# Patient Record
Sex: Female | Born: 1960
Health system: Southern US, Community
[De-identification: ages and names within clinical notes are randomized; demographics above are authoritative.]

## PROBLEM LIST (undated history)

## (undated) DIAGNOSIS — G8929 Other chronic pain: Secondary | ICD-10-CM

## (undated) DIAGNOSIS — E7800 Pure hypercholesterolemia, unspecified: Secondary | ICD-10-CM

## (undated) DIAGNOSIS — R2231 Localized swelling, mass and lump, right upper limb: Secondary | ICD-10-CM

## (undated) DIAGNOSIS — M549 Dorsalgia, unspecified: Secondary | ICD-10-CM

## (undated) DIAGNOSIS — E119 Type 2 diabetes mellitus without complications: Secondary | ICD-10-CM

## (undated) DIAGNOSIS — M1990 Unspecified osteoarthritis, unspecified site: Secondary | ICD-10-CM

## (undated) HISTORY — PX: NO PAST SURGERIES: SHX2092

## (undated) HISTORY — DX: Type 2 diabetes mellitus without complications: E11.9

---

## 2008-07-04 ENCOUNTER — Ambulatory Visit: Payer: Self-pay | Admitting: Nurse Practitioner

## 2008-07-04 DIAGNOSIS — K5900 Constipation, unspecified: Secondary | ICD-10-CM | POA: Insufficient documentation

## 2008-07-04 DIAGNOSIS — H547 Unspecified visual loss: Secondary | ICD-10-CM | POA: Insufficient documentation

## 2008-07-04 DIAGNOSIS — K6289 Other specified diseases of anus and rectum: Secondary | ICD-10-CM | POA: Insufficient documentation

## 2008-07-04 LAB — CONVERTED CEMR LAB
Bilirubin Urine: NEGATIVE
Blood in Urine, dipstick: NEGATIVE
Ketones, urine, test strip: NEGATIVE
pH: 5.5

## 2008-08-08 ENCOUNTER — Ambulatory Visit: Payer: Self-pay | Admitting: Nurse Practitioner

## 2008-08-08 DIAGNOSIS — R109 Unspecified abdominal pain: Secondary | ICD-10-CM | POA: Insufficient documentation

## 2008-08-08 LAB — CONVERTED CEMR LAB
Bilirubin Urine: NEGATIVE
Blood in Urine, dipstick: NEGATIVE
KOH Prep: NEGATIVE
Ketones, urine, test strip: NEGATIVE

## 2008-08-09 LAB — CONVERTED CEMR LAB
Albumin: 4.8 g/dL (ref 3.5–5.2)
Alkaline Phosphatase: 36 units/L — ABNORMAL LOW (ref 39–117)
CO2: 24 meq/L (ref 19–32)
Chlamydia, DNA Probe: NEGATIVE
Eosinophils Absolute: 0.1 10*3/uL (ref 0.0–0.7)
GC Probe Amp, Genital: NEGATIVE
Glucose, Bld: 109 mg/dL — ABNORMAL HIGH (ref 70–99)
LDL Cholesterol: 152 mg/dL — ABNORMAL HIGH (ref 0–99)
Lymphocytes Relative: 49 % — ABNORMAL HIGH (ref 12–46)
Lymphs Abs: 2.4 10*3/uL (ref 0.7–4.0)
Neutro Abs: 2 10*3/uL (ref 1.7–7.7)
Neutrophils Relative %: 41 % — ABNORMAL LOW (ref 43–77)
Platelets: 168 10*3/uL (ref 150–400)
Potassium: 4.2 meq/L (ref 3.5–5.3)
Sodium: 141 meq/L (ref 135–145)
Total Protein: 8.4 g/dL — ABNORMAL HIGH (ref 6.0–8.3)
Triglycerides: 52 mg/dL (ref <150)
WBC: 4.8 10*3/uL (ref 4.0–10.5)

## 2008-08-14 ENCOUNTER — Ambulatory Visit (HOSPITAL_COMMUNITY): Admission: RE | Admit: 2008-08-14 | Discharge: 2008-08-14 | Payer: Self-pay | Admitting: Family Medicine

## 2008-08-21 ENCOUNTER — Encounter: Admission: RE | Admit: 2008-08-21 | Discharge: 2008-08-21 | Payer: Self-pay | Admitting: Family Medicine

## 2008-08-22 ENCOUNTER — Encounter (INDEPENDENT_AMBULATORY_CARE_PROVIDER_SITE_OTHER): Payer: Self-pay | Admitting: Nurse Practitioner

## 2008-09-03 ENCOUNTER — Ambulatory Visit: Payer: Self-pay | Admitting: Nurse Practitioner

## 2008-09-03 DIAGNOSIS — M545 Low back pain, unspecified: Secondary | ICD-10-CM | POA: Insufficient documentation

## 2008-09-03 DIAGNOSIS — E7800 Pure hypercholesterolemia, unspecified: Secondary | ICD-10-CM | POA: Insufficient documentation

## 2008-09-03 DIAGNOSIS — D649 Anemia, unspecified: Secondary | ICD-10-CM | POA: Insufficient documentation

## 2008-09-06 ENCOUNTER — Ambulatory Visit: Payer: Self-pay | Admitting: *Deleted

## 2008-10-03 ENCOUNTER — Ambulatory Visit: Payer: Self-pay | Admitting: Nurse Practitioner

## 2008-10-05 LAB — CONVERTED CEMR LAB
Eosinophils Relative: 3 % (ref 0–5)
HCT: 34.6 % — ABNORMAL LOW (ref 36.0–46.0)
Lymphocytes Relative: 50 % — ABNORMAL HIGH (ref 12–46)
Lymphs Abs: 2.3 10*3/uL (ref 0.7–4.0)
Neutro Abs: 1.7 10*3/uL (ref 1.7–7.7)
Neutrophils Relative %: 38 % — ABNORMAL LOW (ref 43–77)
Platelets: 135 10*3/uL — ABNORMAL LOW (ref 150–400)
WBC: 4.6 10*3/uL (ref 4.0–10.5)

## 2008-12-31 ENCOUNTER — Ambulatory Visit: Payer: Self-pay | Admitting: Nurse Practitioner

## 2008-12-31 DIAGNOSIS — R5383 Other fatigue: Secondary | ICD-10-CM

## 2008-12-31 DIAGNOSIS — R5381 Other malaise: Secondary | ICD-10-CM | POA: Insufficient documentation

## 2009-01-07 ENCOUNTER — Ambulatory Visit (HOSPITAL_COMMUNITY): Admission: RE | Admit: 2009-01-07 | Discharge: 2009-01-07 | Payer: Self-pay | Admitting: Internal Medicine

## 2009-05-30 ENCOUNTER — Ambulatory Visit: Payer: Self-pay | Admitting: Nurse Practitioner

## 2009-05-31 ENCOUNTER — Ambulatory Visit (HOSPITAL_COMMUNITY): Admission: RE | Admit: 2009-05-31 | Discharge: 2009-05-31 | Payer: Self-pay | Admitting: Nurse Practitioner

## 2009-06-03 ENCOUNTER — Encounter (INDEPENDENT_AMBULATORY_CARE_PROVIDER_SITE_OTHER): Payer: Self-pay | Admitting: Nurse Practitioner

## 2009-09-11 IMAGING — CR DG LUMBAR SPINE COMPLETE 4+V
5 series · 5 of 5 positions shown · non-contrast
Comparison: None.

CLINICAL DATA: 48-year-old female with low back pain status post
fall 1 year ago.

LUMBAR SPINE - COMPLETE 4+ VIEW

[t l-spine a.p.]
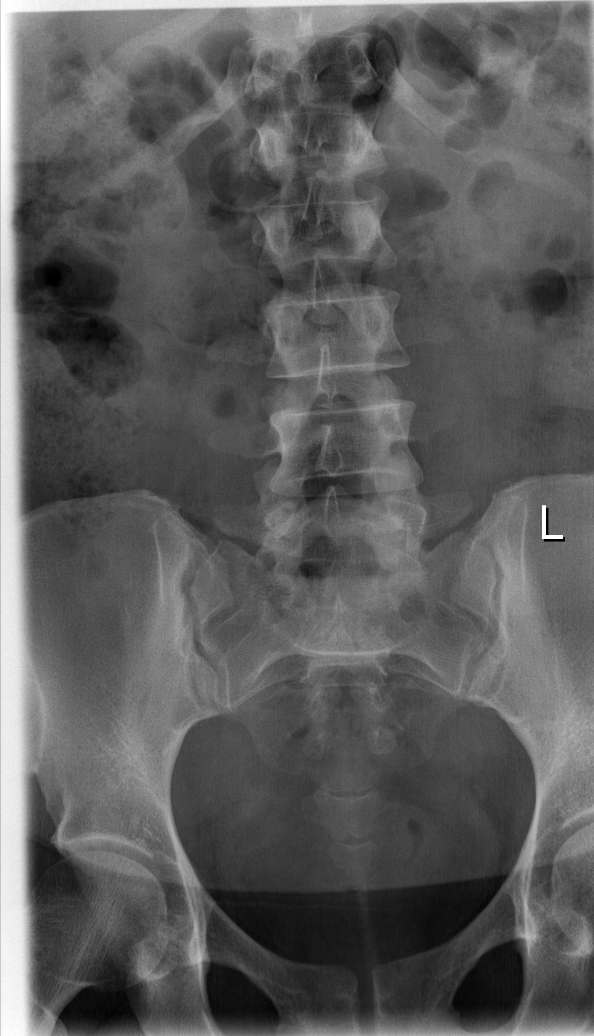

[t l-spine oblique exposure (1 of 2)]
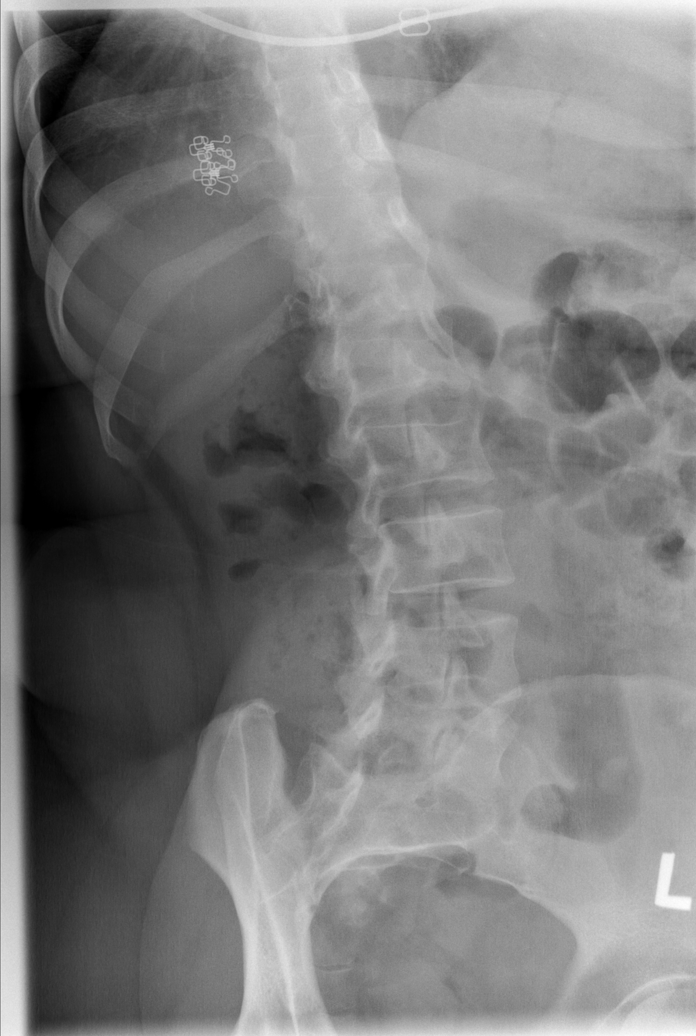

[t l-spine lat]
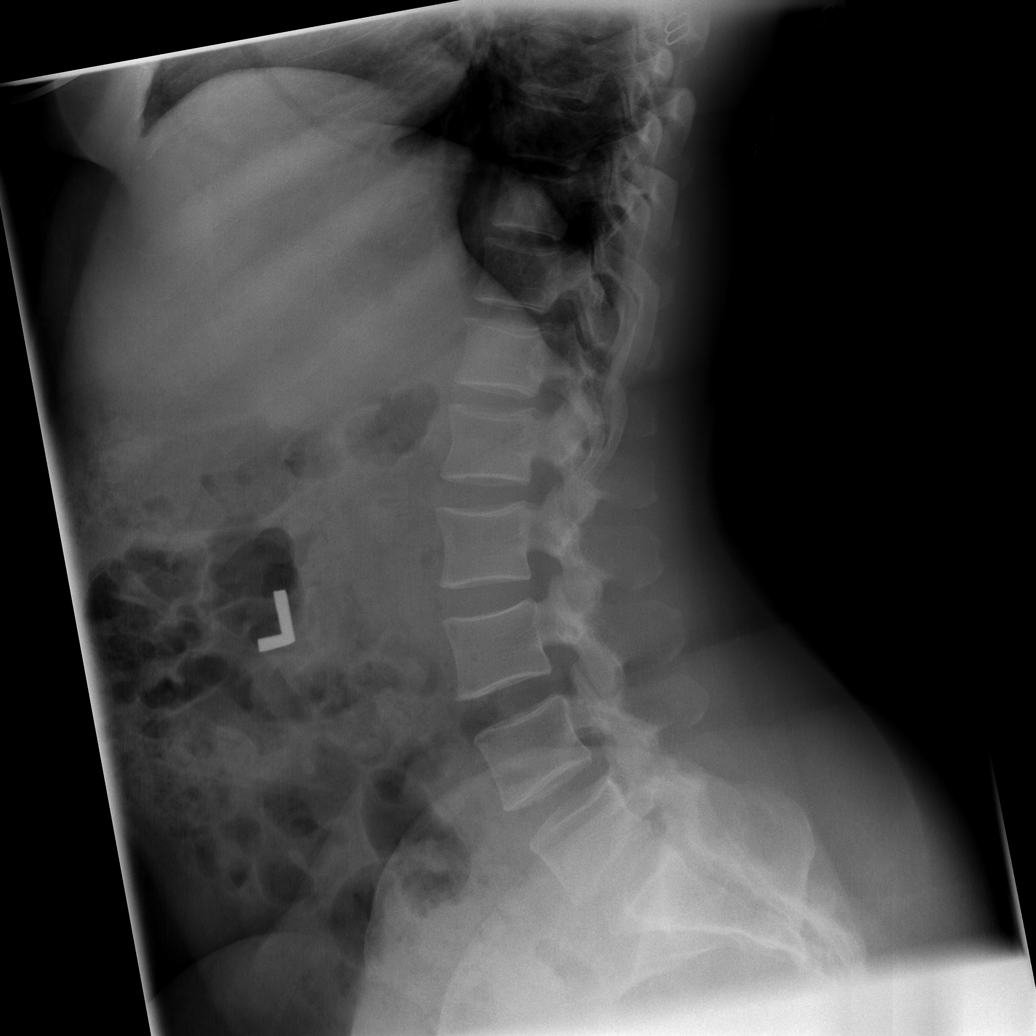

[t l-spine l5-s1 spot]
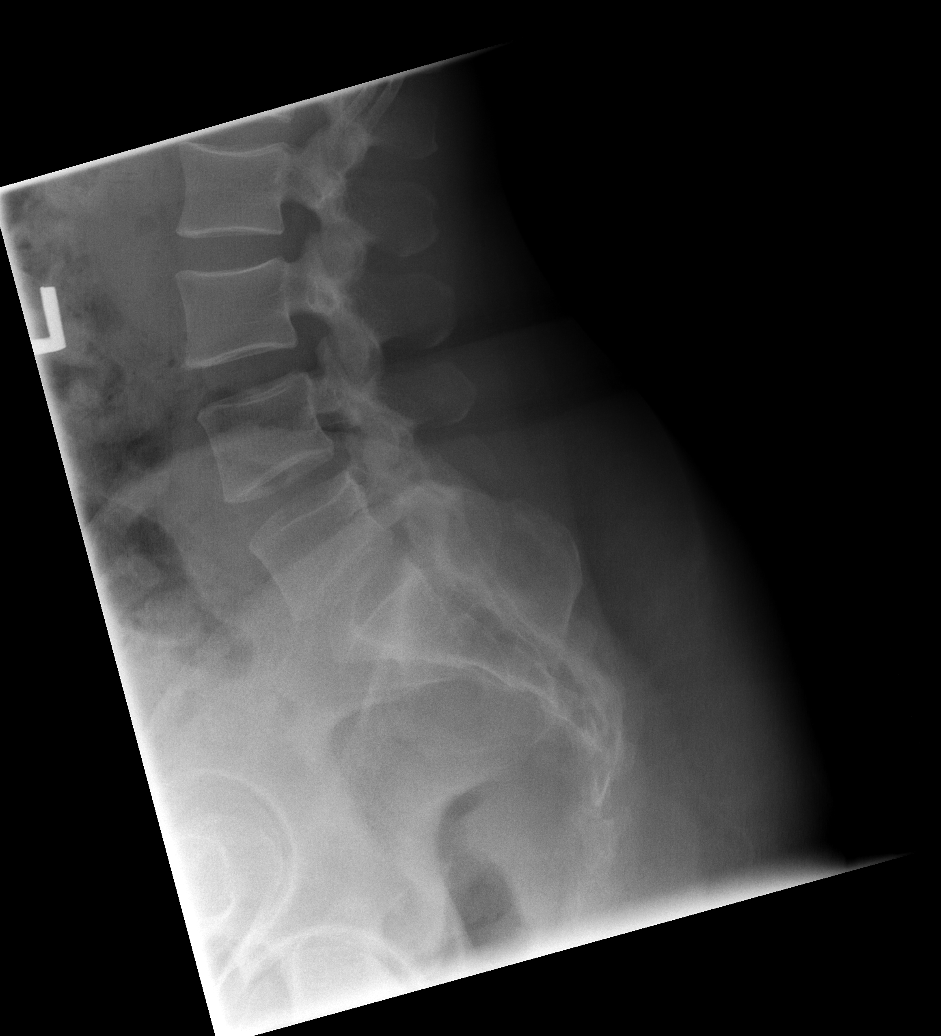

[t l-spine oblique exposure (2 of 2)]
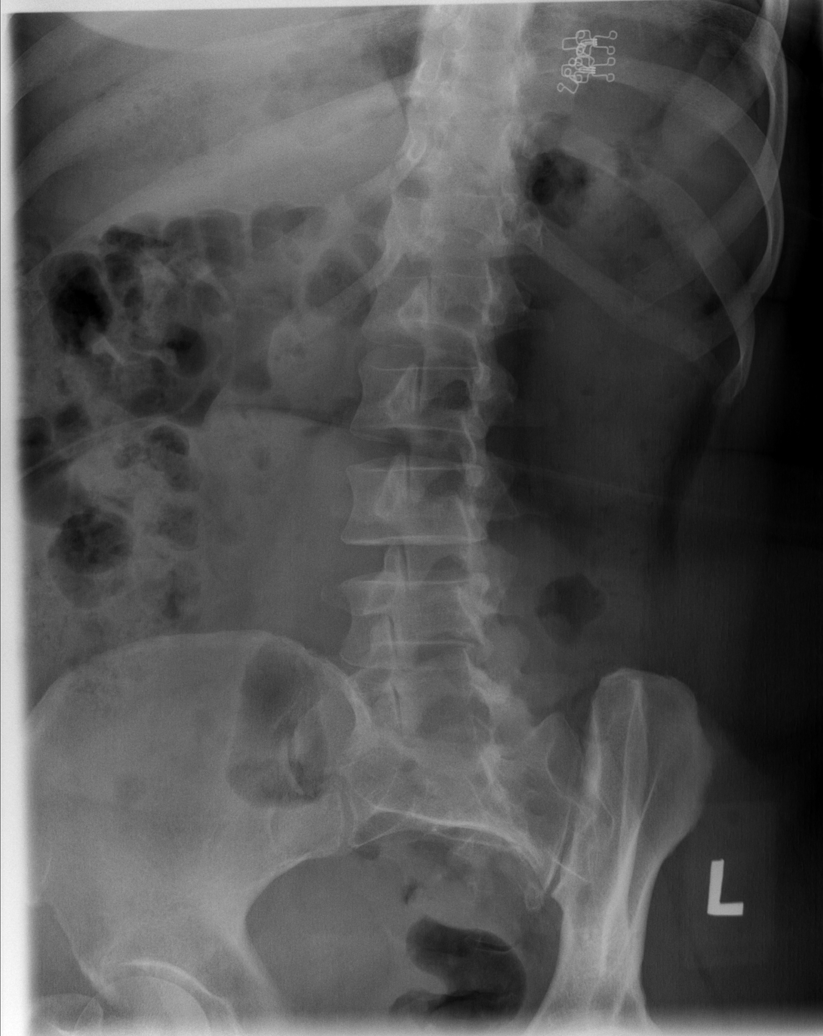

[5 of 5 positions shown; findings below may reference images not displayed]

FINDINGS: Normal lumbar segmentation.  Vertebral body height and
alignment are normal.  Bone mineralization is within normal limits.
Relatively preserved disc spaces throughout.  Sacrum and SI joints
appear within normal limits.  No pars fracture.  Mild L5-S1 facet
hypertrophy.
IMPRESSION: Negative lumbar radiographs for age.

## 2010-06-30 ENCOUNTER — Emergency Department (HOSPITAL_COMMUNITY): Admission: EM | Admit: 2010-06-30 | Discharge: 2010-07-01 | Payer: Self-pay | Admitting: Emergency Medicine

## 2010-06-30 DIAGNOSIS — Z87448 Personal history of other diseases of urinary system: Secondary | ICD-10-CM | POA: Insufficient documentation

## 2010-06-30 LAB — CONVERTED CEMR LAB
Chloride: 106 meq/L
Glucose, Bld: 94 mg/dL
HCT: 30.2 %
Hemoglobin: 10.4 g/dL
MCV: 82.5 fL
Platelets: 105 10*3/uL
RDW: 12.8 %
Sodium: 141 meq/L

## 2010-07-16 ENCOUNTER — Ambulatory Visit: Payer: Self-pay | Admitting: Nurse Practitioner

## 2010-07-16 DIAGNOSIS — R51 Headache: Secondary | ICD-10-CM | POA: Insufficient documentation

## 2010-07-16 DIAGNOSIS — R519 Headache, unspecified: Secondary | ICD-10-CM | POA: Insufficient documentation

## 2010-07-16 LAB — CONVERTED CEMR LAB
Specific Gravity, Urine: 1.02
Urobilinogen, UA: 0.2
WBC Urine, dipstick: NEGATIVE
pH: 5

## 2010-08-14 ENCOUNTER — Ambulatory Visit: Payer: Self-pay | Admitting: Nurse Practitioner

## 2010-08-20 ENCOUNTER — Telehealth (INDEPENDENT_AMBULATORY_CARE_PROVIDER_SITE_OTHER): Payer: Self-pay | Admitting: Nurse Practitioner

## 2010-10-06 ENCOUNTER — Ambulatory Visit: Payer: Self-pay | Admitting: Nurse Practitioner

## 2010-10-06 LAB — CONVERTED CEMR LAB
Blood in Urine, dipstick: NEGATIVE
HDL goal, serum: 40 mg/dL
KOH Prep: NEGATIVE
Nitrite: NEGATIVE
OCCULT 1: NEGATIVE
Specific Gravity, Urine: 1.01
Urobilinogen, UA: 0.2
WBC Urine, dipstick: NEGATIVE

## 2010-10-08 ENCOUNTER — Encounter (INDEPENDENT_AMBULATORY_CARE_PROVIDER_SITE_OTHER): Payer: Self-pay | Admitting: Nurse Practitioner

## 2010-10-10 LAB — CONVERTED CEMR LAB
ALT: 16 units/L (ref 0–35)
AST: 22 units/L (ref 0–37)
Albumin: 4.4 g/dL (ref 3.5–5.2)
Alkaline Phosphatase: 33 units/L — ABNORMAL LOW (ref 39–117)
BUN: 12 mg/dL (ref 6–23)
Basophils Absolute: 0 10*3/uL (ref 0.0–0.1)
Basophils Relative: 1 % (ref 0–1)
Calcium: 9.3 mg/dL (ref 8.4–10.5)
Chlamydia, DNA Probe: NEGATIVE
Chloride: 106 meq/L (ref 96–112)
Eosinophils Absolute: 0.3 10*3/uL (ref 0.0–0.7)
LDL Cholesterol: 116 mg/dL — ABNORMAL HIGH (ref 0–99)
MCHC: 33 g/dL (ref 30.0–36.0)
MCV: 86.3 fL (ref 78.0–100.0)
Monocytes Relative: 8 % (ref 3–12)
Neutro Abs: 2 10*3/uL (ref 1.7–7.7)
Neutrophils Relative %: 40 % — ABNORMAL LOW (ref 43–77)
Platelets: 140 10*3/uL — ABNORMAL LOW (ref 150–400)
Potassium: 3.9 meq/L (ref 3.5–5.3)
RBC: 4.08 M/uL (ref 3.87–5.11)
Sodium: 141 meq/L (ref 135–145)
TSH: 0.802 microintl units/mL (ref 0.350–4.500)
Total Protein: 7.7 g/dL (ref 6.0–8.3)

## 2010-10-14 ENCOUNTER — Telehealth (INDEPENDENT_AMBULATORY_CARE_PROVIDER_SITE_OTHER): Payer: Self-pay | Admitting: Nurse Practitioner

## 2010-10-14 ENCOUNTER — Encounter (INDEPENDENT_AMBULATORY_CARE_PROVIDER_SITE_OTHER): Payer: Self-pay | Admitting: Nurse Practitioner

## 2010-10-21 ENCOUNTER — Ambulatory Visit (HOSPITAL_COMMUNITY)
Admission: RE | Admit: 2010-10-21 | Discharge: 2010-10-21 | Payer: Self-pay | Source: Home / Self Care | Admitting: Internal Medicine

## 2010-10-22 ENCOUNTER — Ambulatory Visit: Payer: Self-pay | Admitting: Nurse Practitioner

## 2010-10-23 ENCOUNTER — Encounter (INDEPENDENT_AMBULATORY_CARE_PROVIDER_SITE_OTHER): Payer: Self-pay | Admitting: Nurse Practitioner

## 2010-11-14 ENCOUNTER — Encounter (INDEPENDENT_AMBULATORY_CARE_PROVIDER_SITE_OTHER): Payer: Self-pay | Admitting: *Deleted

## 2010-11-14 LAB — CONVERTED CEMR LAB
Albumin ELP: 55.7 % — ABNORMAL LOW (ref 55.8–66.1)
Alpha-1-Globulin: 3.6 % (ref 2.9–4.9)
Beta Globulin: 6.2 % (ref 4.7–7.2)
Gamma Globulin: 21.1 % — ABNORMAL HIGH (ref 11.1–18.8)
Iron: 80 ug/dL (ref 42–145)
Saturation Ratios: 22 % (ref 20–55)
Total Protein, Serum Electrophoresis: 7.5 g/dL (ref 6.0–8.3)

## 2010-12-15 ENCOUNTER — Encounter: Payer: Self-pay | Admitting: Family Medicine

## 2010-12-23 NOTE — Assessment & Plan Note (Signed)
Summary: Headache   Vital Signs:  Patient profile:   50 year old female Menstrual status:  regular LMP:     08/14/2010 Weight:      173.2 pounds Temp:     97.6 degrees F oral Pulse rate:   64 / minute Pulse rhythm:   regular Resp:     16 per minute BP sitting:   110 / 70  (left arm) Cuff size:   regular  Vitals Entered By: Levon Hedger (August 14, 2010 3:49 PM) CC: pt is here for CPP, but is on menses, Headache Is Patient Diabetic? No Pain Assessment Patient in pain? no       Does patient need assistance? Functional Status Self care Ambulation Normal LMP (date): 08/14/2010 LMP - Character: normal     Menstrual Status regular Enter LMP: 08/14/2010 Last PAP Result NEGATIVE FOR INTRAEPITHELIAL LESIONS OR MALIGNANCY.   CC:  pt is here for CPP, but is on menses, and Headache.  History of Present Illness:  Pt into the office for a complete physical exam however pt is on her menses. She presents today with her husband.     Headache HPI:      The headaches will last anywhere from 2 hours to 3 days at a time.  She has approximately 3 headaches per month.  The patient is right handed.        The location of the headaches are unilateral-left.  Headache quality is sharp (knife-like).  Aggravating factors include bending down.        Additional history: pt was started on APAP/Butalbuital/Caffiene which is helpful with headache.    Headache Treatment History:      She has tried ASA-butalbital-caffeine which was effective.     Allergies (verified): No Known Drug Allergies  Review of Systems CV:  Denies chest pain or discomfort. Resp:  Denies cough. GI:  Denies abdominal pain, loss of appetite, and nausea. Derm:  Complains of lesion(s). Neuro:  Denies headaches.  Physical Exam  General:  alert.   Head:  normocephalic.   Lungs:  normal breath sounds.   Heart:  normal rate and regular rhythm.   Abdomen:  normal bowel sounds.   Msk:  up to the  exam Neurologic:  alert & oriented X3.     Impression & Recommendations:  Problem # 1:  HEADACHE (ICD-784.0) improved Her updated medication list for this problem includes:    Ibuprofen 800 Mg Tabs (Ibuprofen) .Marland Kitchen... 1 tablet by mouth two times a day for back pain    Butalbital-apap 50-325 Mg Tabs (Butalbital-acetaminophen) ..... One tablet by mouth two times a day as needed for headache  Problem # 2:  ANEMIA (ICD-285.9)  Her updated medication list for this problem includes:    Ferrous Sulfate 325 (65 Fe) Mg Tbec (Ferrous sulfate) .Marland Kitchen... 1 tablet by mouth two times a day for iron  Complete Medication List: 1)  Senokot S 8.6-50 Mg Tabs (Sennosides-docusate sodium) .Marland Kitchen.. 1 tablet by mouth two times a day for bowels 2)  Ferrous Sulfate 325 (65 Fe) Mg Tbec (Ferrous sulfate) .Marland Kitchen.. 1 tablet by mouth two times a day for iron 3)  Ibuprofen 800 Mg Tabs (Ibuprofen) .Marland Kitchen.. 1 tablet by mouth two times a day for back pain 4)  Butalbital-apap 50-325 Mg Tabs (Butalbital-acetaminophen) .... One tablet by mouth two times a day as needed for headache  Patient Instructions: 1)  You need to an antibacterial soap - such as DIal or [place]. 2)  Schedule an appointment  in 2 weeks for a complete physical exam 3)  You can eat breakfast but then nothing but water until your afternoon appointment.  Prevention & Chronic Care Immunizations   Influenza vaccine: Not documented    Tetanus booster: Not documented    Pneumococcal vaccine: Not documented  Other Screening   Pap smear: NEGATIVE FOR INTRAEPITHELIAL LESIONS OR MALIGNANCY.  (08/08/2008)   Pap smear action/deferral: PAP smear done  (08/08/2008)    Mammogram: Right breast mass seen in origingal mammogram was overlying normal tissue.  Normal  (08/21/2008)   Mammogram action/deferral: Screening mammogram in 1 year.     (08/21/2008)   Smoking status: never  (07/16/2010)  Lipids   Total Cholesterol: 211  (08/08/2008)   LDL: 152  (08/08/2008)    LDL Direct: Not documented   HDL: 49  (08/08/2008)   Triglycerides: 52  (08/08/2008)    SGOT (AST): 16  (08/08/2008)   SGPT (ALT): 15  (08/08/2008)   Alkaline phosphatase: 36  (08/08/2008)   Total bilirubin: 1.2  (08/08/2008)  Self-Management Support :    Lipid self-management support: Not documented

## 2010-12-23 NOTE — Progress Notes (Signed)
Summary: NEDDING REFILLS  Phone Note Call from Patient Call back at Home Phone [phone]   Reason for Call: Refill Medication Summary of Call: Matrinpt. MS Keeter CALLED AND SAYS THAT SHE NEEDS REFILL ON ALL 4 OF HER MEDICATIONS. [facility]. Initial call taken by: Leodis Rains,  August 20, 2010 3:25 PM  Follow-up for Phone Call        Pain med request sent to N. Daphine Deutscher. What about the senokot?  Did we write Rx? Ibuprofen x4 refills or just one?  Dutch Quint RN  August 20, 2010 3:44 PM   Additional Follow-up for Phone Call Additional follow up Details #1::        ibuprofen - 1 refill  senokot is otc - [facility] does not have it Additional Follow-up by: Lehman Prom FNP,  August 20, 2010 4:27 PM    Additional Follow-up for Phone Call Additional follow up Details #2::    Notified pt. of refills and that Senokot is OTC.  Dutch Quint RN  August 20, 2010 4:49 PM   Prescriptions: BUTALBITAL-APAP 50-325 MG TABS (BUTALBITAL-ACETAMINOPHEN) One tablet by mouth two times a day as needed for headache  #30 x 0   Entered by:   Dutch Quint RN   Authorized by:   Lehman Prom FNP   Signed by:   Dutch Quint RN on 08/20/2010   Method used:   Faxed to ...       [facility] (retail)       [address] [place], [place]  [address]       Ph: [phone]       Fax: [phone]   RxID:   [id number] IBUPROFEN 800 MG TABS (IBUPROFEN) 1 tablet by mouth two times a day for back pain  #50 x 0   Entered by:   Dutch Quint RN   Authorized by:   Lehman Prom FNP   Signed by:   Dutch Quint RN on 08/20/2010   Method used:   Faxed to ...       [facility] (retail)       [address] [place], [place]  [address]       Ph: [phone]       Fax: [phone]   RxID:   [id number] FERROUS SULFATE 325 (65 FE) MG TBEC (FERROUS SULFATE) 1 tablet by mouth two times a day for iron   #60 x 3   Entered by:   Dutch Quint RN   Authorized by:   Lehman Prom FNP   Signed by:   Dutch Quint RN on 08/20/2010   Method used:   Faxed to ...       [facility] - [facility] (retail)       [address] [place], [place]  [address]       Ph: [phone]       Fax: [phone]   RxID:   [id number]

## 2010-12-23 NOTE — Assessment & Plan Note (Signed)
Summary: Complete Physical Exam   Vital Signs:  Patient profile:   50 year old female Menstrual status:  regular LMP:     10/01/2010 Weight:      175.0 pounds Temp:     97.0 degrees F oral Pulse rate:   70 / minute Pulse rhythm:   regular Resp:     16 per minute BP sitting:   114 / 84  (left arm) Cuff size:   regular  Vitals Entered By: Levon Hedger (October 06, 2010 9:43 AM) CC: CPP, Back Pain, Lipid Management Is Patient Diabetic? No Pain Assessment Patient in pain? no       Does patient need assistance? Functional Status Self care Ambulation Normal LMP (date): 10/01/2010 LMP - Character: normal     Enter LMP: 10/01/2010 Last PAP Result NEGATIVE FOR INTRAEPITHELIAL LESIONS OR MALIGNANCY.   CC:  CPP, Back Pain, and Lipid Management.  History of Present Illness:  Pt into the offcie for a complete physical exam  PAP - last done in this office in 2009  3 children married  Mammogram - no self breast exams at home no family history of breast cancer  Optho  - no recent eye exam  Dental - last recent dental exam  Social - pt is employed at [employer] presents today with her medications  Back Pain History:      The following makes the back pain better: ibuprofen makes pain better.  The following makes the back pain worse: increased activity.    Lipid Management History:      Negative NCEP/ATP III risk factors include female age less than 88 years old, no history of early menopause without estrogen hormone replacement, non-diabetic, non-tobacco-user status, non-hypertensive, no ASHD (atherosclerotic heart disease), no prior stroke/TIA, and no peripheral vascular disease.        The patient states that she knows about the "Therapeutic Lifestyle Change" diet.  The patient does not know about adjunctive measures for cholesterol lowering.  She expresses no side effects from her lipid-lowering medication.  The patient denies any symptoms to suggest  myopathy or liver disease.      Habits & Providers  Alcohol-Tobacco-Diet     Alcohol drinks/day: 0     Tobacco Status: never  Exercise-Depression-Behavior     Does Patient Exercise: no     Have you felt down or hopeless? no     Have you felt little pleasure in things? no     Depression Counseling: not indicated; screening negative for depression     Drug Use: no     Seat Belt Use: 100     Sun Exposure: occasionally  Medications Prior to Update: 1)  Senokot S 8.6-50 Mg  Tabs (Sennosides-Docusate Sodium) .Marland Kitchen.. 1 Tablet By Mouth Two Times A Day For Bowels 2)  Ferrous Sulfate 325 (65 Fe) Mg Tbec (Ferrous Sulfate) .Marland Kitchen.. 1 Tablet By Mouth Two Times A Day For Iron 3)  Ibuprofen 800 Mg Tabs (Ibuprofen) .Marland Kitchen.. 1 Tablet By Mouth Two Times A Day For Back Pain 4)  Butalbital-Apap 50-325 Mg Tabs (Butalbital-Acetaminophen) .... One Tablet By Mouth Two Times A Day As Needed For Headache  Current Medications (verified): 1)  Senokot S 8.6-50 Mg  Tabs (Sennosides-Docusate Sodium) .Marland Kitchen.. 1 Tablet By Mouth Two Times A Day For Bowels 2)  Ferrous Sulfate 325 (65 Fe) Mg Tbec (Ferrous Sulfate) .Marland Kitchen.. 1 Tablet By Mouth Two Times A Day For Iron 3)  Ibuprofen 800 Mg Tabs (Ibuprofen) .Marland Kitchen.. 1 Tablet By  Mouth Two Times A Day For Back Pain 4)  Butalbital-Apap 50-325 Mg Tabs (Butalbital-Acetaminophen) .... One Tablet By Mouth Two Times A Day As Needed For Headache  Allergies (verified): No Known Drug Allergies  Social History: Does Patient Exercise:  no  Review of Systems General:  Denies fever. Eyes:  Denies discharge. ENT:  Denies earache. CV:  Denies chest pain or discomfort. Resp:  Denies cough. GI:  Denies abdominal pain, nausea, and vomiting. GU:  Denies discharge. MS:  Complains of low back pain; denies joint pain. Derm:  Denies dryness. Neuro:  Denies headaches. Psych:  Denies anxiety and depression.  Physical Exam  General:  alert.   Head:  normocephalic.   Eyes:  pupils equal and pupils round.    Ears:  bil TM with bony landmarks present Nose:  no nasal discharge.   Mouth:  fair dentition.   Lungs:  normal breath sounds.   Heart:  normal rate and regular rhythm.   Abdomen:  normal bowel sounds.   Msk:  up to the exam table Neurologic:  alert & oriented X3 and gait normal.   Skin:  color normal.   Psych:  Oriented X3.    Pelvic Exam  Vulva:      normal appearance.   Urethra and Bladder:      Urethra--normal.   Vagina:      dry Cervix:      mulitple cystic lesions Adnexa:      nontender bilaterally.   Rectum:      + external hemorrhoids.      Impression & Recommendations:  Problem # 1:  ROUTINE GYNECOLOGICAL EXAMINATION (ICD-V72.31) labs done  PAP done rec optho and dental exam PHQ-9 (pt did not complete) Orders: T-Lipid Profile (36644-03474) T-Comprehensive Metabolic Panel (25956-38756) T-CBC w/Diff (43329-51884) Rapid HIV  (92370) T-Syphilis Test (RPR) (16606-30160)  Problem # 2:  UNSPECIFIED BREAST SCREENING (ICD-V76.10) self breast exam placcard given mammogram scheduled Orders: Mammogram (Screening) (Mammo)  Problem # 3:  HYPERCHOLESTEROLEMIA (ICD-272.0)  will check labs today  Complete Medication List: 1)  Senokot S 8.6-50 Mg Tabs (Sennosides-docusate sodium) .Marland Kitchen.. 1 tablet by mouth two times a day for bowels 2)  Ferrous Sulfate 325 (65 Fe) Mg Tbec (Ferrous sulfate) .Marland Kitchen.. 1 tablet by mouth two times a day for iron 3)  Ibuprofen 800 Mg Tabs (Ibuprofen) .Marland Kitchen.. 1 tablet by mouth two times a day for back pain 4)  Butalbital-apap 50-325 Mg Tabs (Butalbital-acetaminophen) .... One tablet by mouth two times a day as needed for headache  Other Orders: UA Dipstick w/o Micro (manual) (10932) KOH/ WET Mount [phone]) Pap Smear, Thin Prep ( Collection of) (U2025) T-TSH (42706-23762) EKG w/ Interpretation (93000) Hemoccult Cards MCR Screening (G0107) Tdap => 29yrs IM (83151) Admin 1st Vaccine (76160)  Lipid Assessment/Plan:      Based on NCEP/ATP III,  the patient's risk factor category is "0-1 risk factors".  The patient's lipid goals are as follows: Total cholesterol goal is 200; LDL cholesterol goal is 160; HDL cholesterol goal is 40; Triglyceride goal is 150.    Patient Instructions: 1)  Keep appointment for mammogram 2)  You have been given a tetanus today. 3)  You will be notified of your lab results 4)  Back pain - refills on ibuprofen given today.  Take with food as needed for back pain 5)  Follow up yearly or sooner if needed Prescriptions: IBUPROFEN 800 MG TABS (IBUPROFEN) 1 tablet by mouth two times a day for back pain  #50 x  1   Entered and Authorized by:   Lehman Prom FNP   Signed by:   Lehman Prom FNP on 10/06/2010   Method used:   Print then Give to Patient   RxID:   [id number]    Orders Added: 1)  Mammogram (Screening) [Mammo] 2)  UA Dipstick w/o Micro (manual) [81002] 3)  KOH/ WET Mount [87210] 4)  Pap Smear, Thin Prep ( Collection of) [Q0091] 5)  T-Lipid Profile [80061-22930] 6)  T-Comprehensive Metabolic Panel [80053-22900] 7)  T-CBC w/Diff [95621-30865] 8)  Rapid HIV  [92370] 9)  T-Syphilis Test (RPR) [78469-62952] 10)  T-TSH [84132-44010] 11)  EKG w/ Interpretation [93000] 12)  Hemoccult Cards MCR Screening [G0107] 13)  Tdap => 61yrs IM [90715] 14)  Admin 1st Vaccine [27253]   Immunizations Administered:  Tetanus Vaccine:    Vaccine Type: Tdap    Site: left deltoid    Mfr: Sanofi Pasteur    Dose: 0.5 ml    Route: IM    Given by: Levon Hedger    Exp. Date: 12/10/2012    Lot #: [id number]    VIS given: 10/10/08 version given October 06, 2010.    ndc  74259-563-87  Immunizations Administered:  Tetanus Vaccine:    Vaccine Type: Tdap    Site: left deltoid    Mfr: Sanofi Pasteur    Dose: 0.5 ml    Route: IM    Given by: Levon Hedger    Exp. Date: 12/10/2012    Lot #: [id number]    VIS given: 10/10/08 version given October 06, 2010.  Laboratory Results   Urine  Tests  Date/Time Received: October 06, 2010 10:41 AM   Routine Urinalysis   Color: lt. yellow Appearance: Clear Glucose: negative   (Normal Range: Negative) Bilirubin: negative   (Normal Range: Negative) Ketone: negative   (Normal Range: Negative) Spec. Gravity: 1.010   (Normal Range: 1.003-1.035) Blood: negative   (Normal Range: Negative) pH: 5.0   (Normal Range: 5.0-8.0) Protein: negative   (Normal Range: Negative) Urobilinogen: 0.2   (Normal Range: 0-1) Nitrite: negative   (Normal Range: Negative) Leukocyte Esterace: negative   (Normal Range: Negative)    Date/Time Received: October 06, 2010 2:36 PM   Wet Mount/KOH Source: vaginal WBC/hpf: 1-5 Bacteria/hpf: rare Clue cells/hpf: none Yeast/hpf: none Trichomonas/hpf: none  Other Tests  Rapid HIV: negative  Stool - Occult Blood Hemmoccult #1: negative Date: 10/06/2010      Prevention & Chronic Care Immunizations   Influenza vaccine: Not documented   Influenza vaccine deferral: Not indicated  (10/06/2010)    Tetanus booster: 10/06/2010: Tdap    Pneumococcal vaccine: Not documented  Other Screening   Pap smear: NEGATIVE FOR INTRAEPITHELIAL LESIONS OR MALIGNANCY.  (08/08/2008)   Pap smear action/deferral: PAP smear done  (08/08/2008)    Mammogram: Right breast mass seen in origingal mammogram was overlying normal tissue.  Normal  (08/21/2008)   Mammogram action/deferral: Screening mammogram in 1 year.     (08/21/2008)   Smoking status: never  (10/06/2010)  Lipids   Total Cholesterol: 211  (08/08/2008)   LDL: 152  (08/08/2008)   LDL Direct: Not documented   HDL: 49  (08/08/2008)   Triglycerides: 52  (08/08/2008)    SGOT (AST): 16  (08/08/2008)   SGPT (ALT): 15  (08/08/2008) CMP ordered    Alkaline phosphatase: 36  (08/08/2008)   Total bilirubin: 1.2  (08/08/2008)  Self-Management Support :    Lipid self-management support: Not documented   Laboratory Results   Urine  Tests    Routine  Urinalysis   Color: lt. yellow Appearance: Clear Glucose: negative   (Normal Range: Negative) Bilirubin: negative   (Normal Range: Negative) Ketone: negative   (Normal Range: Negative) Spec. Gravity: 1.010   (Normal Range: 1.003-1.035) Blood: negative   (Normal Range: Negative) pH: 5.0   (Normal Range: 5.0-8.0) Protein: negative   (Normal Range: Negative) Urobilinogen: 0.2   (Normal Range: 0-1) Nitrite: negative   (Normal Range: Negative) Leukocyte Esterace: negative   (Normal Range: Negative)      Wet Mount Wet Mount KOH: Negative  Other Tests  Rapid HIV: negative  Stool - Occult Blood Hemmoccult #1: negative   Appended Document: Complete Physical Exam    Clinical Lists Changes  Orders: Added new Test order of T- GC Chlamydia (41660) - Signed

## 2010-12-23 NOTE — Letter (Signed)
Summary: *HSN Results Follow up  [facility]  [address] [place], [place] [address]   Phone: [phone]  Fax: [phone]      10/14/2010   [facility] [address] [place], [place]  [address]   Dear  Ms. Adriana Simas,                            ____S.Drinkard,FNP   ____D. Gore,FNP       ____B. McPherson,MD   ____V. Rankins,MD    ____E. Mulberry,MD    _X___N. Daphine Deutscher, FNP  ____D. Reche Dixon, MD    ____K. Philipp Deputy, MD    ____Other     This letter is to inform you that your recent test(s):  ___X____Pap Smear    _______Lab Test     _______X-ray    ___X____ is within acceptable limits  _______ requires a medication change  _______ requires a follow-up lab visit  _______ requires a follow-up visit with your Aleksey Newbern   Comments: Pap Smear results are normal.       _________________________________________________________ If you have any questions, please contact our office [phone].                    Sincerely,    Lehman Prom FNP [facility]

## 2010-12-23 NOTE — Assessment & Plan Note (Signed)
Summary: Headaches   Vital Signs:  Patient profile:   50 year old female Weight:      170.2 pounds BMI:     30.02 Temp:     97.8 degrees F oral Pulse rate:   66 / minute Pulse rhythm:   regular Resp:     16 per minute BP sitting:   105 / 65  (left arm) Cuff size:   regular  Vitals Entered By: Levon Hedger (July 16, 2010 3:49 PM)  Nutrition Counseling: Patient's BMI is greater than 25 and therefore counseled on weight management options. CC: follow-up visit [facility]...back pain and headache continue, Headache Is Patient Diabetic? No Pain Assessment Patient in pain? no       Does patient need assistance? Functional Status Self care Ambulation Normal   CC:  follow-up visit [facility]...back pain and headache continue and Headache.  History of Present Illness:  Pt into the office to f/u on ER visit on 06/30/2010. pt presented with abdominal pain.   Full workup done and pt was treated for pyelonephritis. She was started on Cipro and has finished. Abdominal pain and urinary symptoms have resolved  Headache - Pt states that she has headaches about twice per week. she was given vicodin in the hospital for the pyelonephritis which she has also been using for her headache with relief.  Since she completed her supply of meds she has been taking several over the counter medication which she brings today - advil, headache relief, ibuprofen pm Denies sodas  Eye exam done 1 year ago - she has glasses for reading.  Headache HPI:      The patient comes in for an acute visit for headaches.  The headaches will last anywhere from 2 hours to 3 days at a time.  She has approximately 5+ headaches per month.  The patient is right handed.        The location of the headaches are unilateral-left.  Headache quality is sharp (knife-like).  Aggravating factors include bending down.        Additional history: unsure of family history No headache present at this time.    Headache Treatment History:  She has tried ibuprofen which was ineffective.     Habits & Providers  Alcohol-Tobacco-Diet     Alcohol drinks/day: 0     Tobacco Status: never  Exercise-Depression-Behavior     Drug Use: no     Seat Belt Use: 100     Sun Exposure: occasionally  Allergies: No Known Drug Allergies  Review of Systems CV:  Denies chest pain or discomfort. Resp:  Denies cough. GI:  Denies abdominal pain, nausea, and vomiting. GU:  Denies dysuria. Neuro:  Complains of headaches.  Physical Exam  General:  alert.   Head:  normocephalic.   Lungs:  normal breath sounds.   Heart:  normal rate and regular rhythm.   Abdomen:  normal bowel sounds.   Msk:  up to the exam table Neurologic:  alert & oriented X3.   Skin:  color normal.   Psych:  Oriented X3.     Impression & Recommendations:  Problem # 1:  HEADACHE (ICD-784.0) ? migraine headache will give handout Her updated medication list for this problem includes:    Ibuprofen 800 Mg Tabs (Ibuprofen) .Marland Kitchen... 1 tablet by mouth two times a day for back pain    Butalbital-apap 50-325 Mg Tabs (Butalbital-acetaminophen) ..... One tablet by mouth two times a day as needed for headache  Complete Medication List: 1)  Senokot S 8.6-50 Mg Tabs (Sennosides-docusate sodium) .Marland Kitchen.. 1 tablet by mouth two times a day for bowels 2)  Ferrous Sulfate 325 (65 Fe) Mg Tbec (Ferrous sulfate) .Marland Kitchen.. 1 tablet by mouth two times a day for iron 3)  Ibuprofen 800 Mg Tabs (Ibuprofen) .Marland Kitchen.. 1 tablet by mouth two times a day for back pain 4)  Celexa 20 Mg Tabs (Citalopram hydrobromide) .Marland Kitchen.. 1 tablet by mouth daily for mood 5)  Colace 100 Mg Caps (Docusate sodium) .Marland Kitchen.. 1 capsule by mouth two times a day for stools 6)  Butalbital-apap 50-325 Mg Tabs (Butalbital-acetaminophen) .... One tablet by mouth two times a day as needed for headache  Other Orders: UA Dipstick w/o Micro (manual) ([id number])  Patient Instructions: 1)  Record how many headaches you have until your next visit  here. 2)  Bring back calendar with you. 3)  Schedule an appointment for a complete physical exam 4)  Do not eat after midnight before this visit. Prescriptions: BUTALBITAL-APAP 50-325 MG TABS (BUTALBITAL-ACETAMINOPHEN) One tablet by mouth two times a day as needed for headache  #30 x 0   Entered and Authorized by:   Lehman Prom FNP   Signed by:   Lehman Prom FNP on 07/16/2010   Method used:   Print then Give to Patient   RxID:   [id number]   Laboratory Results   Urine Tests  Date/Time Received: July 16, 2010 4:06 PM   Routine Urinalysis   Color: lt. yellow Appearance: Clear Glucose: negative   (Normal Range: Negative) Bilirubin: negative   (Normal Range: Negative) Ketone: negative   (Normal Range: Negative) Spec. Gravity: 1.020   (Normal Range: 1.003-1.035) Blood: negative   (Normal Range: Negative) pH: 5.0   (Normal Range: 5.0-8.0) Protein: negative   (Normal Range: Negative) Urobilinogen: 0.2   (Normal Range: 0-1) Nitrite: negative   (Normal Range: Negative) Leukocyte Esterace: negative   (Normal Range: Negative)         CT of Pelvis  Procedure date:  06/30/2010  Findings:      Asymmetric enlargement of the right kidney, with mild associated soft tissue stranding and haziness, likely reflecting right-sided pyelonephritis.  no evidence of hydronephrosis.  trace pleual effusion; mild bibasilar atelectasis noted   CT of Pelvis  Procedure date:  06/30/2010  Findings:      Asymmetric enlargement of the right kidney, with mild associated soft tissue stranding and haziness, likely reflecting right-sided pyelonephritis.  no evidence of hydronephrosis.  trace pleual effusion; mild bibasilar atelectasis noted

## 2010-12-23 NOTE — Letter (Signed)
Summary: Handout Printed  Printed Handout:  - Headache, Migraine 

## 2010-12-23 NOTE — Progress Notes (Signed)
Summary: Lab results  Phone Note Outgoing Call   Summary of Call: pt with anemia on recent office visit - is she still taking iron supplement? advise pt to return for the following labs: serum electrophoresis  also iron, TIBC, folate, vit b12  Initial call taken by: Lehman Prom FNP,  October 14, 2010 6:15 PM  Follow-up for Phone Call        spoke with daughter and she says pt is taking iron pills will schedule f/u lab appt.Marland KitchenMarland KitchenArmenia Shannon  October 15, 2010 12:30 PM

## 2010-12-25 NOTE — Letter (Signed)
Summary: *HSN Results Follow up  [facility]  [address] [place], [place] [address]   Phone: [phone]  Fax: [phone]      11/14/2010   [facility] [address] [place], [place]  [address]   Dear  Ms. Adriana Simas,                            ____S.Drinkard,FNP   ____D. Gore,FNP       ____B. McPherson,MD   ____V. Rankins,MD    ____E. Mulberry,MD    __X__N. Daphine Deutscher, FNP  ____D. Reche Dixon, MD    ____K. Philipp Deputy, MD    ____Other     This letter is to inform you that your recent test(s):  _______Pap Smear    _______Lab Test     _______X-ray    _______ is within acceptable limits  _______ requires a medication change  ___X____ requires a follow-up lab visit  _______ requires a follow-up visit with your Denisse Whitenack   Comments: Melina Fiddler been trying to contact you at [phone]. Please call us       _________________________________________________________ If you have any questions, please contact our office                     Sincerely,  Hale Drone CMA [facility]

## 2011-02-06 LAB — URINE MICROSCOPIC-ADD ON

## 2011-02-06 LAB — DIFFERENTIAL
Basophils Relative: 0 % (ref 0–1)
Eosinophils Absolute: 0 10*3/uL (ref 0.0–0.7)
Eosinophils Relative: 0 % (ref 0–5)
Lymphocytes Relative: 45 % (ref 12–46)
Lymphs Abs: 3.6 10*3/uL (ref 0.7–4.0)
Monocytes Absolute: 0.7 10*3/uL (ref 0.1–1.0)
Monocytes Relative: 9 % (ref 3–12)
Neutro Abs: 3.7 10*3/uL (ref 1.7–7.7)
Neutrophils Relative %: 31 % — ABNORMAL LOW (ref 43–77)
nRBC: 0 /100 WBC

## 2011-02-06 LAB — URINALYSIS, ROUTINE W REFLEX MICROSCOPIC
Bilirubin Urine: NEGATIVE
Glucose, UA: NEGATIVE mg/dL
Protein, ur: 100 mg/dL — AB
Specific Gravity, Urine: 1.011 (ref 1.005–1.030)
Urobilinogen, UA: 4 mg/dL — ABNORMAL HIGH (ref 0.0–1.0)

## 2011-02-06 LAB — POCT I-STAT, CHEM 8
Calcium, Ion: 1.21 mmol/L (ref 1.12–1.32)
Creatinine, Ser: 0.8 mg/dL (ref 0.4–1.2)
Glucose, Bld: 94 mg/dL (ref 70–99)
Hemoglobin: 11.2 g/dL — ABNORMAL LOW (ref 12.0–15.0)
Potassium: 3.4 mEq/L — ABNORMAL LOW (ref 3.5–5.1)
TCO2: 25 mmol/L (ref 0–100)

## 2011-02-06 LAB — CBC
Hemoglobin: 10.4 g/dL — ABNORMAL LOW (ref 12.0–15.0)
Platelets: 105 10*3/uL — ABNORMAL LOW (ref 150–400)
RBC: 3.66 MIL/uL — ABNORMAL LOW (ref 3.87–5.11)
WBC: 8 10*3/uL (ref 4.0–10.5)

## 2011-02-06 LAB — URINE CULTURE

## 2011-07-08 ENCOUNTER — Ambulatory Visit (HOSPITAL_COMMUNITY)
Admission: RE | Admit: 2011-07-08 | Discharge: 2011-07-08 | Disposition: A | Discharge status: Home / Self Care | Payer: Self-pay | Source: Ambulatory Visit | Attending: Internal Medicine | Admitting: Internal Medicine

## 2011-07-08 ENCOUNTER — Other Ambulatory Visit: Payer: Self-pay | Admitting: Internal Medicine

## 2011-07-08 DIAGNOSIS — R200 Anesthesia of skin: Secondary | ICD-10-CM

## 2011-07-08 DIAGNOSIS — M542 Cervicalgia: Secondary | ICD-10-CM | POA: Insufficient documentation

## 2011-11-05 ENCOUNTER — Other Ambulatory Visit (HOSPITAL_COMMUNITY): Payer: Self-pay | Admitting: Family Medicine

## 2011-11-05 DIAGNOSIS — Z1231 Encounter for screening mammogram for malignant neoplasm of breast: Secondary | ICD-10-CM

## 2011-11-06 ENCOUNTER — Ambulatory Visit (HOSPITAL_COMMUNITY)
Admission: RE | Admit: 2011-11-06 | Discharge: 2011-11-06 | Disposition: A | Discharge status: Home / Self Care | Payer: Self-pay | Source: Ambulatory Visit | Attending: Family Medicine | Admitting: Family Medicine

## 2011-11-06 DIAGNOSIS — Z1231 Encounter for screening mammogram for malignant neoplasm of breast: Secondary | ICD-10-CM | POA: Insufficient documentation

## 2012-07-18 ENCOUNTER — Encounter (HOSPITAL_COMMUNITY): Payer: Self-pay | Admitting: *Deleted

## 2012-07-18 ENCOUNTER — Inpatient Hospital Stay (HOSPITAL_COMMUNITY): Payer: Self-pay

## 2012-07-18 ENCOUNTER — Inpatient Hospital Stay (HOSPITAL_COMMUNITY)
Admission: AD | Admit: 2012-07-18 | Discharge: 2012-07-18 | Disposition: A | Discharge status: Home / Self Care | Payer: Self-pay | Source: Ambulatory Visit | Attending: Obstetrics & Gynecology | Admitting: Obstetrics & Gynecology

## 2012-07-18 DIAGNOSIS — D649 Anemia, unspecified: Secondary | ICD-10-CM

## 2012-07-18 DIAGNOSIS — R109 Unspecified abdominal pain: Secondary | ICD-10-CM

## 2012-07-18 DIAGNOSIS — R1031 Right lower quadrant pain: Secondary | ICD-10-CM | POA: Insufficient documentation

## 2012-07-18 HISTORY — DX: Unspecified osteoarthritis, unspecified site: M19.90

## 2012-07-18 HISTORY — DX: Dorsalgia, unspecified: M54.9

## 2012-07-18 HISTORY — DX: Other chronic pain: G89.29

## 2012-07-18 LAB — WET PREP, GENITAL
Clue Cells Wet Prep HPF POC: NONE SEEN
Trich, Wet Prep: NONE SEEN
WBC, Wet Prep HPF POC: NONE SEEN
Yeast Wet Prep HPF POC: NONE SEEN

## 2012-07-18 LAB — URINALYSIS, ROUTINE W REFLEX MICROSCOPIC
Bilirubin Urine: NEGATIVE
Glucose, UA: NEGATIVE mg/dL
Hgb urine dipstick: NEGATIVE
Specific Gravity, Urine: 1.01 (ref 1.005–1.030)
Urobilinogen, UA: 0.2 mg/dL (ref 0.0–1.0)
pH: 7.5 (ref 5.0–8.0)

## 2012-07-18 LAB — CBC
MCH: 29 pg (ref 26.0–34.0)
MCHC: 34.2 g/dL (ref 30.0–36.0)
RDW: 12.6 % (ref 11.5–15.5)

## 2012-07-18 LAB — POCT PREGNANCY, URINE: Preg Test, Ur: NEGATIVE

## 2012-07-18 MED ORDER — KETOROLAC TROMETHAMINE 60 MG/2ML IM SOLN
60.0000 mg | Freq: Once | INTRAMUSCULAR | Status: AC
  Administered 2012-07-18: 60 mg via INTRAMUSCULAR
  Filled 2012-07-18: qty 2

## 2012-07-18 NOTE — MAU Provider Note (Signed)
History     CSN: [id number]  Arrival date and time: 07/18/12 1303   First Provider Initiated Contact with Patient 07/18/12 1402      Chief Complaint  Patient presents with  . Back Pain   HPI Pt is not pregnant and presents with right lower abdominal pain radiating to her back. Pt did not have a period last month but started bleeding yesterday. Pt denies constipation or diarrhea, nausea/vomiting, or UTI symptoms, fever or chills.  The pain is worse when she moves and when she walks.  The pain is better when she is lying still.    Past Medical History  Diagnosis Date  . Arthritis   . Chronic back pain     Past Surgical History  Procedure Date  . No past surgeries     Family History  Problem Relation Age of Onset  . Other Neg Hx     History  Substance Use Topics  . Smoking status: Never Smoker   . Smokeless tobacco: Not on file  . Alcohol Use: No    Allergies: No Known Allergies  Prescriptions prior to admission  Medication Sig Dispense Refill  . Multiple Vitamin (MULTIVITAMIN WITH MINERALS) TABS Take 1 tablet by mouth daily.      . naproxen sodium (ANAPROX) 220 MG tablet Take 440 mg by mouth daily as needed.        Review of Systems  Constitutional: Negative for fever and chills.  Gastrointestinal: Positive for abdominal pain. Negative for nausea, vomiting, diarrhea and constipation.  Genitourinary: Negative for dysuria, urgency and frequency.  Musculoskeletal: Positive for back pain.  Neurological: Negative for dizziness and headaches.   Physical Exam   Blood pressure 118/74, pulse 62, temperature 98.5 F (36.9 C), temperature source Oral, resp. rate 16, height 5\' 3"  (1.6 m), weight 84.823 kg (187 lb), last menstrual period 05/27/2012.  Physical Exam  Vitals reviewed. Constitutional: She is oriented to person, place, and time. She appears well-developed and well-nourished.  HENT:  Head: Normocephalic.  Eyes: Pupils are equal, round, and reactive to  light.  Neck: Normal range of motion. Neck supple.  Cardiovascular: Normal rate.   Respiratory: Effort normal.  GI: Soft. Bowel sounds are normal. She exhibits no distension. There is tenderness. There is guarding. There is no rebound.  Genitourinary:       Small amount of menstrual blood in vault; cervix clean, parous, uterus NSSC right adnexal tenderness-- no rebound, mild left adnexal tenderness.  Musculoskeletal: Normal range of motion.  Neurological: She is alert and oriented to person, place, and time.  Skin: Skin is warm and dry.  Psychiatric: She has a normal mood and affect.    MAU Course  Procedures Results for orders placed during the hospital encounter of 07/18/12 (from the past 24 hour(s))  URINALYSIS, ROUTINE W REFLEX MICROSCOPIC     Status: Normal   Collection Time   07/18/12  1:40 PM      Component Value Range   Color, Urine YELLOW  YELLOW   APPearance CLEAR  CLEAR   Specific Gravity, Urine 1.010  1.005 - 1.030   pH 7.5  5.0 - 8.0   Glucose, UA NEGATIVE  NEGATIVE mg/dL   Hgb urine dipstick NEGATIVE  NEGATIVE   Bilirubin Urine NEGATIVE  NEGATIVE   Ketones, ur NEGATIVE  NEGATIVE mg/dL   Protein, ur NEGATIVE  NEGATIVE mg/dL   Urobilinogen, UA 0.2  0.0 - 1.0 mg/dL   Nitrite NEGATIVE  NEGATIVE   Leukocytes, UA NEGATIVE  NEGATIVE  POCT PREGNANCY, URINE     Status: Normal   Collection Time   07/18/12  1:47 PM      Component Value Range   Preg Test, Ur NEGATIVE  NEGATIVE  CBC     Status: Abnormal   Collection Time   07/18/12  2:10 PM      Component Value Range   WBC 4.1  4.0 - 10.5 K/uL   RBC 3.72 (*) 3.87 - 5.11 MIL/uL   Hemoglobin 10.8 (*) 12.0 - 15.0 g/dL   HCT 04.5 (*) 40.9 - 81.1 %   MCV 84.9  78.0 - 100.0 fL   MCH 29.0  26.0 - 34.0 pg   MCHC 34.2  30.0 - 36.0 g/dL   RDW 91.4  78.2 - 95.6 %   Platelets 125 (*) 150 - 400 K/uL  WET PREP, GENITAL     Status: Normal   Collection Time   07/18/12  2:15 PM      Component Value Range   Yeast Wet Prep HPF POC  NONE SEEN  NONE SEEN   Trich, Wet Prep NONE SEEN  NONE SEEN   Clue Cells Wet Prep HPF POC NONE SEEN  NONE SEEN   WBC, Wet Prep HPF POC NONE SEEN  NONE SEEN  pt felt better after Toradol Discussed with pt and daughter that her GYN exam and ultrasound were normal with normal urinalysis and white count.  If pt has increase in pain, fever or chills, nausea or vomiting to go to [facility] as possible diverticulitis or possibly appendicitis Clinical Data: Right lower quadrant pain and back pain. LMP  07/17/2012  TRANSABDOMINAL AND TRANSVAGINAL ULTRASOUND OF PELVIS  Technique: Both transabdominal and transvaginal ultrasound  examinations of the pelvis were performed. Transabdominal technique  was performed for global imaging of the pelvis including uterus,  ovaries, adnexal regions, and pelvic cul-de-sac.  It was necessary to proceed with endovaginal exam following the  transabdominal exam to visualize the right ovary.  Comparison: None  Findings:  Uterus: Partly retroverted. Measures 10.7 x 4.5 x 6.2 cm. No  focal uterine mass identified.  Endometrium: Normal in thickness and appearance. Measures 4 mm.  Right ovary: Normal appearance/no adnexal mass. Measures 3.4 x  2.0 x 2.0 cm.  Left ovary: Normal appearance/no adnexal mass. Measures 3.5 x 2.3  x 2.8 cm.  Other findings: No free fluid.  IMPRESSION:  Normal study. No evidence of pelvic mass or other significant  abnormality.  Original Report Authenticated By: Britta Mccreedy, M.D.   Assessment and Plan  Abdominal pain  Mercedes Velasquez 07/18/2012, 2:03 PM

## 2012-07-18 NOTE — MAU Note (Signed)
Back pain started yesterday. abd pain started today, RLQ.  No period last month.  Started bleeding yesterday, small amt.

## 2012-07-19 LAB — GC/CHLAMYDIA PROBE AMP, GENITAL
Chlamydia, DNA Probe: NEGATIVE
GC Probe Amp, Genital: NEGATIVE

## 2013-08-10 ENCOUNTER — Ambulatory Visit (INDEPENDENT_AMBULATORY_CARE_PROVIDER_SITE_OTHER): Payer: Self-pay | Admitting: Family Medicine

## 2013-08-10 ENCOUNTER — Encounter: Payer: Self-pay | Admitting: Obstetrics and Gynecology

## 2013-08-10 VITALS — BP 122/82 | HR 90 | Temp 98.0°F | Ht 64.0 in | Wt 192.6 lb

## 2013-08-10 DIAGNOSIS — Z124 Encounter for screening for malignant neoplasm of cervix: Secondary | ICD-10-CM

## 2013-08-10 DIAGNOSIS — Z1239 Encounter for other screening for malignant neoplasm of breast: Secondary | ICD-10-CM

## 2013-08-10 NOTE — Patient Instructions (Addendum)
Place premenopausal annual exam patient instructions here.  °

## 2013-08-10 NOTE — Progress Notes (Signed)
Here for annual breast/pap exam. C/o lower back pain.c/o rectal bleeding after taking ibuprofen- stopped taking it. Also c/o uterine contractions , but they stopped. Feels like uterus dropped into vagina

## 2013-08-10 NOTE — Progress Notes (Signed)
  Subjective:     Mercedes Velasquez is a 52 y.o. female here for a routine exam.  Current complaints: Complains of low back pain, and occasional "Contraction" Reports prolapsed uterus when she goes to the bathroom and resolves when she stands.  Personal health questionnaire reviewed: no.   Gynecologic History Patient's last menstrual period was 07/19/2013. Contraception: none - condoms Last Pap: 2011. Results were: normal Last mammogram: 2012 Results were: normal birads-1  Obstetric History OB History  Gravida Para Term Preterm AB SAB TAB Ectopic Multiple Living  5 4 4  1 1    3     # Outcome Date GA Lbr Len/2nd Weight Sex Delivery Anes PTL Lv  5 TRM      SVD     4 TRM      SVD     3 TRM      SVD     2 SAB           1 TRM      SVD          The following portions of the patient's history were reviewed and updated as appropriate: allergies, current medications, past family history, past medical history, past social history, past surgical history and problem list.  Review of Systems Pertinent items are noted in HPI.    Objective:    BP 122/82  Pulse 90  Temp(Src) 98 F (36.7 C)  Ht 5\' 4"  (1.626 m)  Wt 87.363 kg (192 lb 9.6 oz)  BMI 33.04 kg/m2  LMP 07/19/2013  General Appearance:    Alert, cooperative, no distress, appears stated age  Head:    Normocephalic, without obvious abnormality, atraumatic  Neck:   Supple, symmetrical, trachea midline, no adenopathy;    thyroid:  no enlargement/tenderness/nodules; no carotid   bruit or JVD  Back:     Symmetric, no curvature, ROM normal, no CVA tenderness  Lungs:     Clear to auscultation bilaterally, respirations unlabored  Chest Wall:    No tenderness or deformity   Heart:    Regular rate and rhythm, S1 and S2 normal, no murmur, rub   or gallop  Breast Exam:    No tenderness, masses, or nipple abnormality  Abdomen:     Soft, mild tender on RLQ,    no masses, no organomegaly  Genitalia:    Normal female without lesion,  discharge or tenderness Normal cervix, no CMT, +right adenexal tenderness  Extremities:   Extremities normal, atraumatic, no cyanosis or edema  Pulses:   2+ and symmetric all extremities  Skin:   Skin color, texture, turgor normal, no rashes or lesions  Lymph nodes:   Cervical, supraclavicular, and axillary nodes normal          Assessment:    Healthy female exam.  ?uterine prolapse not visualized today, Constipation   Plan:  Pap with HPV F/u 3d prior to menstraul period to eval for prolapse. None seen on exam at this time. Abdominal and back pain appear to be related to constipation. Pt has relief with milk or prunes. Recommend increased milk or prunes. F/u with PCM for Lipids, colonoscopy and chronic back pain.   Education reviewed: safe sex/STD prevention. Contraception: condoms. Mammogram ordered. Follow up in: 1 year.    Tawana Scale, MD OB Fellow

## 2013-08-14 DIAGNOSIS — Z124 Encounter for screening for malignant neoplasm of cervix: Secondary | ICD-10-CM | POA: Insufficient documentation

## 2013-08-15 ENCOUNTER — Encounter: Payer: Self-pay | Admitting: *Deleted

## 2013-08-15 ENCOUNTER — Encounter: Payer: Self-pay | Admitting: Family Medicine

## 2013-08-19 ENCOUNTER — Encounter: Payer: Self-pay | Admitting: *Deleted

## 2013-08-23 ENCOUNTER — Encounter: Payer: Self-pay | Admitting: *Deleted

## 2013-10-18 ENCOUNTER — Encounter (HOSPITAL_COMMUNITY): Payer: Self-pay | Admitting: Emergency Medicine

## 2013-10-18 ENCOUNTER — Emergency Department (HOSPITAL_COMMUNITY): Payer: BC Managed Care – PPO

## 2013-10-18 ENCOUNTER — Emergency Department (HOSPITAL_COMMUNITY)
Admission: EM | Admit: 2013-10-18 | Discharge: 2013-10-18 | Disposition: A | Discharge status: Home / Self Care | Payer: BC Managed Care – PPO | Attending: Emergency Medicine | Admitting: Emergency Medicine

## 2013-10-18 DIAGNOSIS — Z3202 Encounter for pregnancy test, result negative: Secondary | ICD-10-CM | POA: Insufficient documentation

## 2013-10-18 DIAGNOSIS — N12 Tubulo-interstitial nephritis, not specified as acute or chronic: Secondary | ICD-10-CM | POA: Insufficient documentation

## 2013-10-18 HISTORY — DX: Dorsalgia, unspecified: M54.9

## 2013-10-18 LAB — CBC WITH DIFFERENTIAL/PLATELET
Basophils Absolute: 0 10*3/uL (ref 0.0–0.1)
Basophils Relative: 0 % (ref 0–1)
Eosinophils Relative: 1 % (ref 0–5)
HCT: 32.5 % — ABNORMAL LOW (ref 36.0–46.0)
Lymphocytes Relative: 16 % (ref 12–46)
MCHC: 34.5 g/dL (ref 30.0–36.0)
MCV: 85.1 fL (ref 78.0–100.0)
Neutro Abs: 4.3 10*3/uL (ref 1.7–7.7)
Neutrophils Relative %: 78 % — ABNORMAL HIGH (ref 43–77)
Platelets: 92 10*3/uL — ABNORMAL LOW (ref 150–400)
RDW: 12.4 % (ref 11.5–15.5)
WBC: 5.5 10*3/uL (ref 4.0–10.5)

## 2013-10-18 LAB — POCT I-STAT, CHEM 8
Calcium, Ion: 1.17 mmol/L (ref 1.12–1.23)
Chloride: 105 mEq/L (ref 96–112)
Glucose, Bld: 262 mg/dL — ABNORMAL HIGH (ref 70–99)
Potassium: 3.5 mEq/L (ref 3.5–5.1)
Sodium: 141 mEq/L (ref 135–145)
TCO2: 23 mmol/L (ref 0–100)

## 2013-10-18 LAB — URINALYSIS, ROUTINE W REFLEX MICROSCOPIC
Nitrite: NEGATIVE
Specific Gravity, Urine: 1.012 (ref 1.005–1.030)
Urobilinogen, UA: 1 mg/dL (ref 0.0–1.0)
pH: 6 (ref 5.0–8.0)

## 2013-10-18 LAB — URINE MICROSCOPIC-ADD ON

## 2013-10-18 MED ORDER — DEXTROSE 5 % IV SOLN
1.0000 g | Freq: Once | INTRAVENOUS | Status: AC
  Administered 2013-10-18: 1 g via INTRAVENOUS
  Filled 2013-10-18: qty 10

## 2013-10-18 MED ORDER — ACETAMINOPHEN 650 MG RE SUPP
650.0000 mg | Freq: Once | RECTAL | Status: AC
  Administered 2013-10-18: 650 mg via RECTAL
  Filled 2013-10-18: qty 1

## 2013-10-18 MED ORDER — HYDROCODONE-ACETAMINOPHEN 5-325 MG PO TABS: 1.0000 | ORAL_TABLET | Freq: Four times a day (QID) | ORAL | Status: DC | PRN

## 2013-10-18 MED ORDER — IBUPROFEN 800 MG PO TABS: 800.0000 mg | ORAL_TABLET | Freq: Three times a day (TID) | ORAL | Status: DC

## 2013-10-18 MED ORDER — SODIUM CHLORIDE 0.9 % IV BOLUS (SEPSIS): 1000.0000 mL | Freq: Once | INTRAVENOUS | Status: DC

## 2013-10-18 MED ORDER — SULFAMETHOXAZOLE-TRIMETHOPRIM 800-160 MG PO TABS: 1.0000 | ORAL_TABLET | Freq: Two times a day (BID) | ORAL | Status: DC

## 2013-10-18 MED ORDER — KETOROLAC TROMETHAMINE 30 MG/ML IJ SOLN
30.0000 mg | Freq: Once | INTRAMUSCULAR | Status: AC
  Administered 2013-10-18: 30 mg via INTRAVENOUS
  Filled 2013-10-18: qty 1

## 2013-10-18 NOTE — ED Notes (Signed)
Patient transported to X-ray and CT 

## 2013-10-18 NOTE — ED Provider Notes (Signed)
CSN: [id number]     Arrival date & time 10/18/13  0344 History   First MD Initiated Contact with Patient 10/18/13 0407     Chief Complaint  Patient presents with  . Fever   (Consider location/radiation/quality/duration/timing/severity/associated sxs/prior Treatment) Patient is a 52 y.o. female presenting with fever. The history is provided by the patient.  Fever Temp source:  Subjective Severity:  Moderate Onset quality:  Gradual Duration:  3 days Timing:  Constant Progression:  Unchanged Chronicity:  New Relieved by:  Nothing Worsened by:  Nothing tried Ineffective treatments:  None tried Associated symptoms: dysuria   Associated symptoms: no chest pain, no chills, no confusion, no cough, no diarrhea, no headaches, no nausea, no rash, no rhinorrhea, no somnolence and no vomiting   Associated symptoms comment:  Suprapubic pain Risk factors: sick contacts   Risk factors: no hx of cancer and no recent travel     Past Medical History  Diagnosis Date  . Back pain    History reviewed. No pertinent past surgical history. History reviewed. No pertinent family history. History  Substance Use Topics  . Smoking status: Never Smoker   . Smokeless tobacco: Not on file  . Alcohol Use: No   OB History   Grav Para Term Preterm Abortions TAB SAB Ect Mult Living                 Review of Systems  Constitutional: Positive for fever. Negative for chills.  HENT: Negative for rhinorrhea.   Respiratory: Negative for cough.   Cardiovascular: Negative for chest pain.  Gastrointestinal: Negative for nausea, vomiting and diarrhea.  Genitourinary: Positive for dysuria.  Skin: Negative for rash.  Neurological: Negative for headaches.  Psychiatric/Behavioral: Negative for confusion.  All other systems reviewed and are negative.    Allergies  Review of patient's allergies indicates no known allergies.  Home Medications   Current Outpatient Rx  Name  Route  Sig  Dispense  Refill  .  diphenhydrAMINE (SOMINEX) 25 MG tablet   Oral   Take 25 mg by mouth at bedtime as needed for allergies or sleep.         Marland Kitchen ibuprofen (ADVIL,MOTRIN) 200 MG tablet   Oral   Take 400 mg by mouth every 8 (eight) hours as needed for fever.          BP 117/66  Pulse 112  Temp(Src) 101.7 F (38.7 C) (Oral)  Resp 24  Ht 5\' 4"  (1.626 m)  SpO2 96%  LMP 10/01/2013 Physical Exam  Constitutional: She is oriented to person, place, and time. She appears well-developed and well-nourished. No distress.  HENT:  Head: Normocephalic and atraumatic.  Mouth/Throat: Oropharynx is clear and moist.  Eyes: Conjunctivae are normal. Pupils are equal, round, and reactive to light.  Neck: Normal range of motion. Neck supple.  Cardiovascular: Normal rate and regular rhythm.   Pulmonary/Chest: Effort normal and breath sounds normal. No respiratory distress. She has no wheezes. She has no rales. She exhibits no tenderness.  Abdominal: Soft. Bowel sounds are normal. There is no rebound and no guarding.  Mild suprapubic tenderness.  No murphys sign no pain at mcburneys point  Musculoskeletal: Normal range of motion.  Neurological: She is alert and oriented to person, place, and time.  Skin: Skin is warm and dry.  Psychiatric: She has a normal mood and affect.    ED Course  Procedures (including critical care time) Labs Review Labs Reviewed  CBC WITH DIFFERENTIAL - Abnormal; Notable for the  following:    RBC 3.82 (*)    Hemoglobin 11.2 (*)    HCT 32.5 (*)    Platelets 92 (*)    Neutrophils Relative % 78 (*)    All other components within normal limits  URINALYSIS, ROUTINE W REFLEX MICROSCOPIC - Abnormal; Notable for the following:    APPearance CLOUDY (*)    Glucose, UA 500 (*)    Hgb urine dipstick MODERATE (*)    Protein, ur 30 (*)    Leukocytes, UA LARGE (*)    All other components within normal limits  URINE MICROSCOPIC-ADD ON - Abnormal; Notable for the following:    Bacteria, UA MANY (*)     All other components within normal limits  POCT I-STAT, CHEM 8 - Abnormal; Notable for the following:    Glucose, Bld 262 (*)    Hemoglobin 11.6 (*)    HCT 34.0 (*)    All other components within normal limits  URINE CULTURE  POCT PREGNANCY, URINE   Imaging Review Ct Abdomen Pelvis Wo Contrast  10/18/2013   CLINICAL DATA:  Flank pain and hematuria. Body aches and feeling cold off and on for the past few days.  EXAM: CT ABDOMEN AND PELVIS WITHOUT CONTRAST  TECHNIQUE: Multidetector CT imaging of the abdomen and pelvis was performed following the standard protocol without intravenous contrast.  COMPARISON:  None.  FINDINGS: The study is technically limited due to motion artifact throughout.  Motion artifact in the lung bases.  No discrete infiltrates.  Hazy appearance to the mesentery is probably due to motion artifact but inflammatory changes such as pancreatitis are not entirely excluded. The kidneys appear symmetrical. No discrete ureteral stones are demonstrated. No evidence of pyelocaliectasis or ureterectasis.  The unenhanced appearance of the liver, spleen, gallbladder, pancreas, adrenal glands, abdominal aorta, inferior vena cava, and retroperitoneal lymph nodes is grossly unremarkable, allowing for motion artifact. The stomach, small bowel, and colon are not abnormally distended. No free air or free fluid is visualized in the abdomen.  Pelvis: The uterus and adnexal structures are not appear enlarged. Bladder wall is not thickened. No free or loculated pelvic fluid collections are demonstrated. No evidence of diverticulitis. The appendix is not identified. No destructive bone lesions are appreciated.  IMPRESSION: Severe technical limitation of the study due to motion artifact. No gross evidence of renal or ureteral stone or obstruction. Hazy appearance to the mesentery is probably due to the motion artifact but inflammatory process is pancreatitis is not entirely excluded.   Electronically  Signed   By: Burman Nieves M.D.   On: 10/18/2013 06:53   Dg Chest 2 View  10/18/2013   CLINICAL DATA:  Fever and confusion.  EXAM: CHEST  2 VIEW  COMPARISON:  None.  FINDINGS: Shallow inspiration. Normal heart size and pulmonary vascularity. No focal airspace consolidation in the lungs. No blunting of costophrenic angles. No pneumothorax.  IMPRESSION: No active cardiopulmonary disease.   Electronically Signed   By: Burman Nieves M.D.   On: 10/18/2013 06:35    EKG Interpretation   None       MDM  No diagnosis found. Will treat for pyelonephritis.  Return for weakness vomiting or any concerns. Suspect sugar elevated due to stress will not treat at this time but will refer to PMD for recheck and ongoing labs.  Patient verbalizes understanding and agrees to follow up    Tria Noguera K Neco Kling-Rasch, MD 10/18/13 [phone]

## 2013-10-18 NOTE — ED Notes (Signed)
C/o body aches and feeling cold off and on over past few days.  Took ibuprofen before bedtime 400mg  po.  Husband gave allergy tablet so pt would sleep.  Pt denies difficulty urinating or having BM.  Stomach hurts.

## 2013-10-20 ENCOUNTER — Encounter: Payer: Self-pay | Admitting: Obstetrics and Gynecology

## 2013-10-20 LAB — URINE CULTURE

## 2013-10-21 NOTE — Progress Notes (Signed)
ED Antimicrobial Stewardship Positive Culture Follow Up   Mercedes Velasquez is an 52 y.o. female who presented to [facility] on 10/18/2013 with a chief complaint of  Chief Complaint  Patient presents with  . Fever    Recent Results (from the past 720 hour(s))  URINE CULTURE     Status: None   Collection Time    10/18/13  4:35 AM      Result Value Range Status   Specimen Description URINE, CLEAN CATCH   Final   Special Requests NONE ADDED AT 0536   Final   Culture  Setup Time     Final   Value: 10/18/2013 06:07     Performed at [facility]   Colony Count     Final   Value: >=100,000 COLONIES/ML     Performed at [facility]   Culture     Final   Value: ESCHERICHIA COLI     Performed at [facility]   Report Status 10/20/2013 FINAL   Final   Organism ID, Bacteria ESCHERICHIA COLI   Final    [x]  Treated with Septra, organism resistant to prescribed antimicrobial  New antibiotic prescription: Cipro 500mg  PO BID x 10 days.  Patient should stop Septra.  ED Provider: Marlon Pel, PA-C   Sallee Provencal 10/21/2013, 10:56 AM Infectious Diseases Pharmacist Phone# [phone]

## 2013-10-22 ENCOUNTER — Telehealth (HOSPITAL_COMMUNITY): Payer: Self-pay | Admitting: Emergency Medicine

## 2013-10-22 NOTE — ED Notes (Signed)
Post ED Visit - Positive Culture Follow-up: Successful Patient Follow-Up  Culture assessed and recommendations reviewed by: []  Wes Dulaney, Pharm.D., BCPS []  Celedonio Miyamoto, Pharm.D., BCPS []  Georgina Pillion, Pharm.D., BCPS []  [place], [address].D., BCPS, AAHIVP [x]  Estella Husk, Pharm.D., BCPS, AAHIVP  Positive urine culture  []  Patient discharged without antimicrobial prescription and treatment is now indicated [x]  Organism is resistant to prescribed ED discharge antimicrobial []  Patient with positive blood cultures  Changes discussed with ED provider: Marlon Pel PA-C New antibiotic prescription: Cipro 500 mg PO BID x 10 days    Kylie A Holland 10/22/2013, 12:12 PM

## 2014-03-18 ENCOUNTER — Encounter (HOSPITAL_COMMUNITY): Payer: Self-pay | Admitting: Emergency Medicine

## 2014-03-18 ENCOUNTER — Emergency Department (HOSPITAL_COMMUNITY)
Admission: EM | Admit: 2014-03-18 | Discharge: 2014-03-18 | Disposition: A | Discharge status: Home / Self Care | Payer: No Typology Code available for payment source | Source: Home / Self Care | Attending: Family Medicine | Admitting: Family Medicine

## 2014-03-18 DIAGNOSIS — M25519 Pain in unspecified shoulder: Secondary | ICD-10-CM

## 2014-03-18 DIAGNOSIS — M25512 Pain in left shoulder: Secondary | ICD-10-CM

## 2014-03-18 LAB — POCT I-STAT, CHEM 8
BUN: 13 mg/dL (ref 6–23)
CHLORIDE: 102 meq/L (ref 96–112)
CREATININE: 0.8 mg/dL (ref 0.50–1.10)
Calcium, Ion: 1.2 mmol/L (ref 1.12–1.23)
Glucose, Bld: 250 mg/dL — ABNORMAL HIGH (ref 70–99)
HCT: 38 % (ref 36.0–46.0)
HEMOGLOBIN: 12.9 g/dL (ref 12.0–15.0)
POTASSIUM: 4.1 meq/L (ref 3.7–5.3)
SODIUM: 141 meq/L (ref 137–147)
TCO2: 27 mmol/L (ref 0–100)

## 2014-03-18 LAB — TSH: TSH: 1.12 u[IU]/mL (ref 0.350–4.500)

## 2014-03-18 MED ORDER — DICLOFENAC SODIUM 50 MG PO TBEC: 50.0000 mg | DELAYED_RELEASE_TABLET | Freq: Two times a day (BID) | ORAL | Status: DC | PRN

## 2014-03-18 NOTE — ED Notes (Signed)
TSH 1.120 within normal limits. Mercedes [facility]asquez [facility] 03/18/2014

## 2014-03-18 NOTE — ED Notes (Signed)
C/O constant fatigue since Wed.  Started with intermittent LUE pain radiating from shoulder into back, down arm, and up into neck.  No change in pain with deep breathing; denies n/v, diaphoresis, but describes "hot flashes" since January (recently went through menopause).  Takes Tylenol with complete relief at times.  Denies any pain at present.
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
## 2014-03-18 NOTE — Discharge Instructions (Signed)
Thank you for coming in today. Take diclofenac twice daily for pain as needed Followup with the primary care Dr.  PRIMARY CARE CytogeneticistDOCTORS [facility] [address]  [place], [place] [phone]  Fax [phone]  Nature conservation officerLeBauer HealthCare at [facility] [address]  [place], [place] Ph [phone]  Fax [phone]  Nature conservation officerLeBauer HealthCare at [place] / [place] [id number]  [place], [place] [phone]  Fax [phone]  [facility] [address]  [place], [place] Ph [phone]  Fax [phone]  [facility] [address]  [place], [place] Ph [phone]  Fax [phone]  [facility] [address] [place]  [place], [place] Ph [phone]  Fax [phone]   [facility] @ [place] [address] [place] [address] Phone: [phone]   [facility] @ [facility] [address] [place] [address] Phone: [phone]   [facility] @ [place] [address] [place] [address] Phone: [phone]   [facility] @ [place] [address] [place] [address] Phone: [phone]   [facility] @ [place] [address] [place] [address] Phone: [phone] Fax: [phone]   [facility] @ [place] [address] [place] [address] Phone: [phone]

## 2014-07-04 ENCOUNTER — Encounter: Payer: Self-pay | Admitting: Dietician

## 2014-07-04 ENCOUNTER — Encounter: Payer: No Typology Code available for payment source | Attending: Internal Medicine | Admitting: Dietician

## 2014-07-04 VITALS — Ht 65.0 in | Wt 183.6 lb

## 2014-07-04 DIAGNOSIS — IMO0001 Reserved for inherently not codable concepts without codable children: Secondary | ICD-10-CM | POA: Diagnosis not present

## 2014-07-04 DIAGNOSIS — E118 Type 2 diabetes mellitus with unspecified complications: Secondary | ICD-10-CM

## 2014-07-04 DIAGNOSIS — Z713 Dietary counseling and surveillance: Secondary | ICD-10-CM | POA: Insufficient documentation

## 2014-07-04 DIAGNOSIS — E1165 Type 2 diabetes mellitus with hyperglycemia: Secondary | ICD-10-CM

## 2014-07-04 DIAGNOSIS — IMO0002 Reserved for concepts with insufficient information to code with codable children: Secondary | ICD-10-CM

## 2014-07-04 NOTE — Patient Instructions (Signed)
Plan:  Aim for 3 Carb Choices per meal (45 grams) +/- 1 either way  Aim for 0-1 servings of Carbohydrate (0-15g) per snack if hungry Consider including protein with meals and snacks Consider reading food labels for Total Carbohydrate and Fat Grams of foods Continue to be active in your garden and at work

## 2014-07-04 NOTE — Progress Notes (Signed)
  Medical Nutrition Therapy:  Appt start time: 1100 end time:  1215.   Assessment:  Primary concerns today: Diabetes. Patient recently diagnosed with diabetes. Patient reports checking blood sugar at home (see below). Patient's diet consists of rice, beans, wheat (Nigerian dish made from wheat flour) milk, vegetables, chicken, beef and fish. After being diagnosed with diabetes, patient has significantly decreased her intake of milk and her serving sizes of carbohydrates. Patient did report that milk helps her stay regular - patient denies abdominal pain or diarrhea with milk consumption - but does report that she is constipated when she does not drink milk.  Blood Sugar: Morning readings 110-130, sometimes 150 Sometimes evenings before dinner 130 Currently taking 500 mg Metformin in the morning and 1000mg  in the evening.  Preferred Learning Style:   No preference indicated   Learning Readiness:   Change in progress   MEDICATIONS: See Chart   DIETARY INTAKE:  Usual eating pattern includes 3 meals and 1 snacks per day.  Everyday foods include beans, rice, milk, oatmeal/cereal, wheat.  Avoided foods include none mentioned - does not regularly consume sweets, juices or sugar-sweetened beverages.    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   Nutritional Diagnosis:  NB-1.1 Food and nutrition-related  knowledge deficit As related to lack of knowledge of foods that raise blood sugar and portion sizes.  As evidenced by A1C of 7.9% and patient report.    Intervention:  Nutrition education and counseling. Reviewed appropriate pre and post - meal blood sugar ranges, what an A1C test is. How food affects blood sugar and what happens when someone has diabetes. Reviewed what foods do and don't raise blood sugar, servings sizes and carbohydrate counting. Carbohydrate counting with MyPlate.   Plan:  Aim for 3 Carb Choices per meal (45 grams) +/- 1 either way  Aim for 0-1 servings of Carbohydrate (0-15g) per snack if hungry Consider including protein with meals and snacks Consider reading food labels for Total Carbohydrate and Fat Grams of foods Continue to be active in your garden and at work  Teaching Method Utilized:  Armed forces training and education officerVisual Auditory Hands on  Handouts given during visit include:  Yellow card  MyPlate  Barriers to learning/adherence to lifestyle change: None  Demonstrated degree of understanding via:  Teach Back   Monitoring/Evaluation:  Dietary intake, exercise, carbohydrate counting, and body weight prn.

## 2014-09-24 ENCOUNTER — Encounter: Payer: Self-pay | Admitting: Dietician

## 2016-09-17 ENCOUNTER — Other Ambulatory Visit: Payer: Self-pay | Admitting: Internal Medicine

## 2016-09-17 DIAGNOSIS — Z1231 Encounter for screening mammogram for malignant neoplasm of breast: Secondary | ICD-10-CM

## 2016-10-02 ENCOUNTER — Ambulatory Visit
Admission: RE | Admit: 2016-10-02 | Discharge: 2016-10-02 | Disposition: A | Discharge status: Home / Self Care | Payer: BLUE CROSS/BLUE SHIELD | Source: Ambulatory Visit | Attending: Internal Medicine | Admitting: Internal Medicine

## 2016-10-02 DIAGNOSIS — Z1231 Encounter for screening mammogram for malignant neoplasm of breast: Secondary | ICD-10-CM

## 2017-03-15 ENCOUNTER — Telehealth: Payer: Self-pay | Admitting: Neurology

## 2017-03-15 ENCOUNTER — Encounter: Payer: BLUE CROSS/BLUE SHIELD | Admitting: Neurology

## 2017-03-15 NOTE — Telephone Encounter (Signed)
This patient did not show for a EMG and nerve conduction study evaluation today. 

## 2017-03-16 ENCOUNTER — Encounter: Payer: Self-pay | Admitting: Neurology

## 2017-09-01 ENCOUNTER — Other Ambulatory Visit: Payer: Self-pay | Admitting: Internal Medicine

## 2017-09-01 DIAGNOSIS — Z1231 Encounter for screening mammogram for malignant neoplasm of breast: Secondary | ICD-10-CM

## 2017-10-05 ENCOUNTER — Ambulatory Visit
Admission: RE | Admit: 2017-10-05 | Discharge: 2017-10-05 | Disposition: A | Discharge status: Home / Self Care | Payer: BLUE CROSS/BLUE SHIELD | Source: Ambulatory Visit | Attending: Internal Medicine | Admitting: Internal Medicine

## 2017-10-05 DIAGNOSIS — Z1231 Encounter for screening mammogram for malignant neoplasm of breast: Secondary | ICD-10-CM

## 2017-12-17 ENCOUNTER — Other Ambulatory Visit (HOSPITAL_COMMUNITY): Payer: Self-pay | Admitting: Internal Medicine

## 2017-12-17 ENCOUNTER — Ambulatory Visit (HOSPITAL_COMMUNITY)
Admission: RE | Admit: 2017-12-17 | Discharge: 2017-12-17 | Disposition: A | Discharge status: Home / Self Care | Payer: BLUE CROSS/BLUE SHIELD | Source: Ambulatory Visit | Attending: Vascular Surgery | Admitting: Vascular Surgery

## 2017-12-17 DIAGNOSIS — M79605 Pain in left leg: Secondary | ICD-10-CM | POA: Insufficient documentation

## 2018-03-02 ENCOUNTER — Ambulatory Visit (HOSPITAL_COMMUNITY)
Admission: EM | Admit: 2018-03-02 | Discharge: 2018-03-02 | Disposition: A | Discharge status: Other Acute Inpatient Hospital | Payer: BLUE CROSS/BLUE SHIELD

## 2018-09-01 ENCOUNTER — Telehealth: Payer: Self-pay | Admitting: Internal Medicine

## 2018-09-01 ENCOUNTER — Other Ambulatory Visit: Payer: Self-pay | Admitting: Internal Medicine

## 2018-09-01 MED ORDER — METFORMIN HCL 500 MG PO TABS: 1500.0000 mg | ORAL_TABLET | Freq: Every day | ORAL | 0 refills | Status: DC

## 2018-09-01 NOTE — Telephone Encounter (Signed)
Phone note only  

## 2018-09-06 ENCOUNTER — Other Ambulatory Visit: Payer: Self-pay

## 2018-09-06 NOTE — Telephone Encounter (Signed)
Returned a call to the pt's husband Mr. Miano and left a message that [facility] sent a refill to [facility] for the Metformin on October 10th.

## 2018-09-07 ENCOUNTER — Other Ambulatory Visit: Payer: Self-pay | Admitting: Internal Medicine

## 2019-04-27 ENCOUNTER — Encounter: Payer: BLUE CROSS/BLUE SHIELD | Admitting: Internal Medicine

## 2020-01-23 ENCOUNTER — Other Ambulatory Visit: Payer: Self-pay | Admitting: Physician Assistant

## 2020-01-23 ENCOUNTER — Other Ambulatory Visit (HOSPITAL_COMMUNITY)
Admission: RE | Admit: 2020-01-23 | Discharge: 2020-01-23 | Disposition: A | Discharge status: Home / Self Care | Payer: 59 | Source: Ambulatory Visit | Attending: Physician Assistant | Admitting: Physician Assistant

## 2020-01-23 DIAGNOSIS — Z124 Encounter for screening for malignant neoplasm of cervix: Secondary | ICD-10-CM | POA: Insufficient documentation

## 2020-01-25 LAB — CYTOLOGY - PAP: Diagnosis: NEGATIVE

## 2020-07-10 ENCOUNTER — Other Ambulatory Visit: Payer: Self-pay | Admitting: Internal Medicine

## 2020-07-10 DIAGNOSIS — Z1231 Encounter for screening mammogram for malignant neoplasm of breast: Secondary | ICD-10-CM

## 2020-07-26 ENCOUNTER — Other Ambulatory Visit: Payer: Self-pay

## 2020-07-26 ENCOUNTER — Ambulatory Visit
Admission: RE | Admit: 2020-07-26 | Discharge: 2020-07-26 | Disposition: A | Discharge status: Home / Self Care | Payer: 59 | Source: Ambulatory Visit | Attending: Internal Medicine | Admitting: Internal Medicine

## 2020-07-26 DIAGNOSIS — Z1231 Encounter for screening mammogram for malignant neoplasm of breast: Secondary | ICD-10-CM

## 2020-09-11 ENCOUNTER — Other Ambulatory Visit: Payer: Self-pay | Admitting: Orthopedic Surgery

## 2020-09-16 ENCOUNTER — Encounter (HOSPITAL_BASED_OUTPATIENT_CLINIC_OR_DEPARTMENT_OTHER): Payer: Self-pay | Admitting: Orthopedic Surgery

## 2020-09-17 ENCOUNTER — Other Ambulatory Visit: Payer: Self-pay

## 2020-09-17 ENCOUNTER — Encounter (HOSPITAL_BASED_OUTPATIENT_CLINIC_OR_DEPARTMENT_OTHER): Payer: Self-pay | Admitting: Orthopedic Surgery

## 2020-09-19 ENCOUNTER — Other Ambulatory Visit (HOSPITAL_COMMUNITY): Payer: 59

## 2020-09-23 ENCOUNTER — Ambulatory Visit (HOSPITAL_BASED_OUTPATIENT_CLINIC_OR_DEPARTMENT_OTHER): Admit: 2020-09-23 | Payer: 59 | Admitting: Orthopedic Surgery

## 2020-09-23 HISTORY — DX: Localized swelling, mass and lump, right upper limb: R22.31

## 2020-09-23 SURGERY — EXCISION MASS
Anesthesia: Monitor Anesthesia Care | Laterality: Right

## 2020-10-03 ENCOUNTER — Ambulatory Visit (HOSPITAL_COMMUNITY)
Admission: EM | Admit: 2020-10-03 | Discharge: 2020-10-03 | Disposition: A | Discharge status: Home / Self Care | Payer: 59 | Attending: Family Medicine | Admitting: Family Medicine

## 2020-10-03 ENCOUNTER — Other Ambulatory Visit: Payer: Self-pay

## 2020-10-03 ENCOUNTER — Ambulatory Visit (INDEPENDENT_AMBULATORY_CARE_PROVIDER_SITE_OTHER): Payer: 59

## 2020-10-03 ENCOUNTER — Encounter (HOSPITAL_COMMUNITY): Payer: Self-pay | Admitting: Emergency Medicine

## 2020-10-03 DIAGNOSIS — M25512 Pain in left shoulder: Secondary | ICD-10-CM | POA: Diagnosis not present

## 2020-10-03 DIAGNOSIS — M25562 Pain in left knee: Secondary | ICD-10-CM

## 2020-10-03 DIAGNOSIS — M25511 Pain in right shoulder: Secondary | ICD-10-CM

## 2020-10-03 DIAGNOSIS — M545 Low back pain, unspecified: Secondary | ICD-10-CM

## 2020-10-03 MED ORDER — CYCLOBENZAPRINE HCL 5 MG PO TABS: 5.0000 mg | ORAL_TABLET | Freq: Three times a day (TID) | ORAL | 0 refills | Status: DC | PRN
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
## 2021-01-14 ENCOUNTER — Other Ambulatory Visit: Payer: Self-pay

## 2021-01-14 ENCOUNTER — Ambulatory Visit (HOSPITAL_COMMUNITY)
Admission: EM | Admit: 2021-01-14 | Discharge: 2021-01-14 | Disposition: A | Discharge status: Home / Self Care | Payer: 59 | Attending: Family Medicine | Admitting: Family Medicine

## 2021-01-14 ENCOUNTER — Encounter (HOSPITAL_COMMUNITY): Payer: Self-pay | Admitting: Emergency Medicine

## 2021-01-14 DIAGNOSIS — Z789 Other specified health status: Secondary | ICD-10-CM | POA: Diagnosis not present

## 2021-01-14 DIAGNOSIS — Z1152 Encounter for screening for COVID-19: Secondary | ICD-10-CM | POA: Diagnosis not present

## 2021-01-14 DIAGNOSIS — R509 Fever, unspecified: Secondary | ICD-10-CM | POA: Diagnosis not present

## 2021-01-14 LAB — COMPREHENSIVE METABOLIC PANEL
ALT: 92 U/L — ABNORMAL HIGH (ref 0–44)
AST: 49 U/L — ABNORMAL HIGH (ref 15–41)
Albumin: 3.3 g/dL — ABNORMAL LOW (ref 3.5–5.0)
Alkaline Phosphatase: 134 U/L — ABNORMAL HIGH (ref 38–126)
Anion gap: 10 (ref 5–15)
BUN: 7 mg/dL (ref 6–20)
CO2: 21 mmol/L — ABNORMAL LOW (ref 22–32)
Calcium: 8.9 mg/dL (ref 8.9–10.3)
Chloride: 108 mmol/L (ref 98–111)
Creatinine, Ser: 0.81 mg/dL (ref 0.44–1.00)
GFR, Estimated: >60 mL/min (ref >60)
Glucose, Bld: 102 mg/dL — ABNORMAL HIGH (ref 70–99)
Potassium: 3.3 mmol/L — ABNORMAL LOW (ref 3.5–5.1)
Sodium: 139 mmol/L (ref 135–145)
Total Bilirubin: 1.5 mg/dL — ABNORMAL HIGH (ref 0.3–1.2)
Total Protein: 7.1 g/dL (ref 6.5–8.1)

## 2021-01-14 LAB — CBC
HCT: 27.9 % — ABNORMAL LOW (ref 36.0–46.0)
Hemoglobin: 9.3 g/dL — ABNORMAL LOW (ref 12.0–15.0)
MCH: 28.1 pg (ref 26.0–34.0)
MCHC: 33.3 g/dL (ref 30.0–36.0)
MCV: 84.3 fL (ref 80.0–100.0)
Platelets: 141 10*3/uL — ABNORMAL LOW (ref 150–400)
RBC: 3.31 MIL/uL — ABNORMAL LOW (ref 3.87–5.11)
RDW: 13 % (ref 11.5–15.5)
WBC: 7.7 10*3/uL (ref 4.0–10.5)
nRBC: 0 % (ref 0.0–0.2)

## 2021-01-14 MED ORDER — ACETAMINOPHEN 325 MG PO TABS
ORAL_TABLET | ORAL | Status: AC
  Filled 2021-01-14: qty 2

## 2021-01-14 MED ORDER — ATOVAQUONE-PROGUANIL HCL 250-100 MG PO TABS: 4.0000 | ORAL_TABLET | Freq: Every day | ORAL | 0 refills | Status: AC

## 2021-01-14 MED ORDER — ACETAMINOPHEN 325 MG PO TABS
650.0000 mg | ORAL_TABLET | Freq: Once | ORAL | Status: AC
  Administered 2021-01-14: 650 mg via ORAL

## 2021-01-14 MED ORDER — ONDANSETRON HCL 8 MG PO TABS: 8.0000 mg | ORAL_TABLET | Freq: Three times a day (TID) | ORAL | 0 refills | Status: AC | PRN

## 2021-01-14 NOTE — Discharge Instructions (Addendum)
-  We are checking your bloodwork today, including a CBC, CMP, and malaria screening. We're also checking for covid-19.  -I sent a medication to treat for malaraia- atovaquone-proguanil (MALARONE), 4 pills taken at the same time once  daily, for 3 days in a row. If your malaria test comes back as positive, you can start this.  -I also sent Zofran, which is a medication that you can use to treat nausea and vomiting. Take this up to 3 pills daily. Make sure to drink plenty of fluids and eat normal diet as tolerated. -Continue Aspirin or ibuprofen and tylenol 1000mg  3x daily.  -Head straight to the ED if you experience chest pain, worst headache of life, dizziness, etc.

## 2021-01-14 NOTE — ED Provider Notes (Addendum)
[facility]    CSN: [id number] Arrival date & time: 01/14/21  1431      History   Chief Complaint Chief Complaint  Patient presents with   Chills    HPI Mercedes Velasquez is a 60 y.o. female presenting with chills, headache, body  aches, fatigue since Sunday. History arthritis, back pain, diabetes.  She is here today with daughter who is providing translation.  Daughter states that patient came back from [place] on 1/29 (3 weeks ago).  She felt fine initially, but 6 days ago she developed fever/chills, headaches, body aches, fatigue, poor appetite.  Has been taking BC aspirin powder for discomfort.  Daughter states that this patient had enough medication for prophylactic treatment for malaria for 1 month, but she stayed for 2 months and ran out of this medication.  Daughter is significantly concerned for malaria, parasites, Covid, etc. Denies chest pain, weakness, worst headache of life, vision changes, dizziness, n/v/d/c/abd pain. They are requesting full battery of labwork and testing.    HPI  Past Medical History:  Diagnosis Date   Arthritis    OA bil knees and shoulders   Back pain    Chronic back pain    Diabetes mellitus without complication (HCC)    Forearm mass, right     Patient Active Problem List   Diagnosis Date Noted   Pap smear for cervical cancer screening 08/14/2013   HEADACHE 07/16/2010   PYELONEPHRITIS, HX OF 06/30/2010   FATIGUE 12/31/2008   HYPERCHOLESTEROLEMIA 09/03/2008   ANEMIA 09/03/2008   LUMBAGO 09/03/2008   PELVIC  PAIN 08/08/2008   VISUAL ACUITY, DECREASED 07/04/2008   CONSTIPATION 07/04/2008   ANAL OR RECTAL PAIN 07/04/2008    Past Surgical History:  Procedure Laterality Date   NO PAST SURGERIES      OB History    Gravida  5   Para  4   Term  4   Preterm      AB  1   Living  3     SAB  1   IAB      Ectopic      Multiple      Live Births               Home Medications    Prior  to Admission medications   Medication Sig Start Date End Date Taking? Authorizing Provider  atovaquone-proguanil (MALARONE) 250-100 MG TABS tablet Take 4 tablets by mouth daily for 3 days. 01/14/21 01/17/21 Yes Rhys Martini, PA-C  metFORMIN (GLUCOPHAGE-XR) 500 MG 24 hr tablet TAKE 1 TABLET BY MOUTH ONCE DAILY IN THE MORNING AND 2 TABLETS ONCE DAILY IN THE EVENING 09/07/18  Yes Dorothyann Peng, MD  ondansetron (ZOFRAN) 8 MG tablet Take 1 tablet (8 mg total) by mouth every 8 (eight) hours as needed for nausea or vomiting. 01/14/21  Yes Rhys Martini, PA-C    Family History Family History  Problem Relation Age of Onset   Other Neg Hx     Social History Social History   Tobacco Use   Smoking status: Never Smoker   Smokeless tobacco: Never Used  Substance Use Topics   Alcohol use: No   Drug use: No     Allergies   Patient has no known allergies.   Review of Systems Review of Systems  Constitutional: Positive for appetite change, chills, fatigue and fever.  HENT: Positive for congestion. Negative for ear pain, rhinorrhea, sinus pressure, sinus pain and sore throat.   Eyes:  Negative for redness and visual disturbance.  Respiratory: Negative for cough, chest tightness, shortness of breath and wheezing.   Cardiovascular: Negative for chest pain and palpitations.  Gastrointestinal: Negative for abdominal pain, constipation, diarrhea, nausea and vomiting.  Genitourinary: Negative for dysuria, frequency and urgency.  Musculoskeletal: Negative for myalgias.  Neurological: Negative for dizziness, weakness and headaches.  Psychiatric/Behavioral: Negative for confusion.  All other systems reviewed and are negative.    Physical Exam Triage Vital Signs ED Triage Vitals  Enc Vitals Group     BP 01/14/21 1513 136/79     Pulse Rate 01/14/21 1513 (!) 101     Resp 01/14/21 1513 (!) 22     Temp 01/14/21 1513 (!) 102.1 F (38.9 C)     Temp Source 01/14/21 1513 Oral     SpO2  01/14/21 1513 97 %     Weight --      Height --      Head Circumference --      Peak Flow --      Pain Score 01/14/21 1508 10     Pain Loc --      Pain Edu? --      Excl. in GC? --    No data found.  Updated Vital Signs BP 136/79 (BP Location: Left Arm)    Pulse 96    Temp (!) 100.4 F (38 C) (Oral)    Resp (!) 22    LMP 10/01/2013    SpO2 97%   Visual Acuity Right Eye Distance:   Left Eye Distance:   Bilateral Distance:    Right Eye Near:   Left Eye Near:    Bilateral Near:     Physical Exam Vitals reviewed.  Constitutional:      General: She is not in acute distress.    Appearance: Normal appearance. She is ill-appearing.  HENT:     Head: Normocephalic and atraumatic.     Right Ear: Hearing, tympanic membrane, ear canal and external ear normal. No swelling or tenderness. There is no impacted cerumen. No mastoid tenderness. Tympanic membrane is not perforated, erythematous, retracted or bulging.     Left Ear: Hearing, tympanic membrane, ear canal and external ear normal. No swelling or tenderness. There is no impacted cerumen. No mastoid tenderness. Tympanic membrane is not perforated, erythematous, retracted or bulging.     Nose:     Right Sinus: No maxillary sinus tenderness or frontal sinus tenderness.     Left Sinus: No maxillary sinus tenderness or frontal sinus tenderness.     Mouth/Throat:     Mouth: Mucous membranes are moist.     Pharynx: Uvula midline. No oropharyngeal exudate or posterior oropharyngeal erythema.     Tonsils: No tonsillar exudate.  Cardiovascular:     Rate and Rhythm: Normal rate and regular rhythm.     Heart sounds: Normal heart sounds.  Pulmonary:     Breath sounds: Normal breath sounds and air entry. No wheezing, rhonchi or rales.  Chest:     Chest wall: No tenderness.  Abdominal:     General: Abdomen is flat. Bowel sounds are normal.     Tenderness: There is no abdominal tenderness. There is no right CVA tenderness, left CVA  tenderness, guarding or rebound.  Lymphadenopathy:     Cervical: No cervical adenopathy.  Neurological:     General: No focal deficit present.     Mental Status: She is alert and oriented to person, place, and time.     Comments:  CN 2-12 grossly intact.  Psychiatric:        Attention and Perception: Attention and perception normal.        Mood and Affect: Mood and affect normal.        Behavior: Behavior normal. Behavior is cooperative.        Thought Content: Thought content normal.        Judgment: Judgment normal.      UC Treatments / Results  Labs (all labs ordered are listed, but only abnormal results are displayed) Labs Reviewed  SARS CORONAVIRUS 2 (TAT 6-24 HRS)  CBC  COMPREHENSIVE METABOLIC PANEL  PARASITE EXAM SCREEN, BLOOD-W CONF TO [facility] (NOT @ [facility])    EKG   Radiology No results found.  Procedures Procedures (including critical care time)  Medications Ordered in UC Medications  acetaminophen (TYLENOL) tablet 650 mg (650 mg Oral Given 01/14/21 1522)    Initial Impression / Assessment and Plan / UC Course  I have reviewed the triage vital signs and the nursing notes.  Pertinent labs & imaging results that were available during my care of the patient were reviewed by me and considered in my medical decision making (see chart for details).     This patient is a 60 year old female presenting with chills, headaches, body aches, fatigue x6 days following returning from [place]. States she ran out of her antimalarial medication and is very concerned about malaria. Today she is mildly febrile but nontachycardic nontachypneic, oxygenating well on room air. Exam fairly benign.  Will check a CBC, CMP, malaria. Also sent treatment for malaria; we discussed that she can start this now. Pt is concerned about cost, and they state they prefer to wait until positive lab result to start this. covid sent. Isolation as per CDC guidelines.  Tylenol provided for antipyretic  with reduction in fever. Continue aspirin, tylenol for symptomatic relief.  Strict return precautions discussed.  Patient and family member with many questions and concerns. All questions were answered and over 75% of this visit was spent providing counseling.   Using translator, spent over 40 minutes obtaining H&P, performing physical, discussing results, treatment plan and plan for follow-up with patient. Patient agrees with plan.   Final Clinical Impressions(s) / UC Diagnoses   Final diagnoses:  Recent travel to [place]  Febrile illness  Encounter for screening for COVID-19     Discharge Instructions     -We are checking your bloodwork today, including a CBC, CMP, and malaria screening. We're also checking for covid-19.  -I sent a medication to treat for malaraia- atovaquone-proguanil (MALARONE), 4 pills taken at the same time once  daily, for 3 days in a row. If your malaria test comes back as positive, you can start this.  -I also sent Zofran, which is a medication that you can use to treat nausea and vomiting. Take this up to 3 pills daily. Make sure to drink plenty of fluids and eat normal diet as tolerated. -Continue Aspirin or ibuprofen and tylenol 1000mg  3x daily.  -Head straight to the ED if you experience chest pain, worst headache of life, dizziness, etc.     ED Prescriptions    Medication Sig Dispense Auth. Provider   atovaquone-proguanil (MALARONE) 250-100 MG TABS tablet Take 4 tablets by mouth daily for 3 days. 12 tablet Rhys MartiniGraham, Donney Caraveo E, PA-C   ondansetron (ZOFRAN) 8 MG tablet Take 1 tablet (8 mg total) by mouth every 8 (eight) hours as needed for nausea or vomiting. 20 tablet Ignacia BayleyGraham, Hardin Hardenbrook  E, PA-C     PDMP not reviewed this encounter.   Rhys Martini, PA-C 01/14/21 1700    Rhys Martini, PA-C 01/15/21 [phone]

## 2021-01-14 NOTE — ED Triage Notes (Signed)
Having chills, headache, body  aches, fatigue since Sunday.  Poor appetite.  Has been taking BC powders for discomfort

## 2021-01-15 ENCOUNTER — Telehealth (HOSPITAL_COMMUNITY): Payer: Self-pay | Admitting: Emergency Medicine

## 2021-01-15 LAB — PARASITE EXAM SCREEN, BLOOD-W CONF TO LABCORP (NOT @ ARMC)

## 2021-01-15 LAB — SARS CORONAVIRUS 2 (TAT 6-24 HRS): SARS Coronavirus 2: NEGATIVE

## 2021-01-15 NOTE — Telephone Encounter (Signed)
Received Malaria results from lab, forwarded to provider for review

## 2021-01-16 ENCOUNTER — Emergency Department (HOSPITAL_COMMUNITY)
Admission: EM | Admit: 2021-01-16 | Discharge: 2021-01-16 | Disposition: A | Discharge status: Home / Self Care | Payer: 59 | Attending: Emergency Medicine | Admitting: Emergency Medicine

## 2021-01-16 ENCOUNTER — Encounter (HOSPITAL_COMMUNITY): Payer: Self-pay

## 2021-01-16 ENCOUNTER — Other Ambulatory Visit: Payer: Self-pay

## 2021-01-16 ENCOUNTER — Emergency Department (HOSPITAL_COMMUNITY): Payer: 59

## 2021-01-16 DIAGNOSIS — R1011 Right upper quadrant pain: Secondary | ICD-10-CM | POA: Diagnosis not present

## 2021-01-16 DIAGNOSIS — R109 Unspecified abdominal pain: Secondary | ICD-10-CM

## 2021-01-16 DIAGNOSIS — Z20822 Contact with and (suspected) exposure to covid-19: Secondary | ICD-10-CM | POA: Insufficient documentation

## 2021-01-16 DIAGNOSIS — E119 Type 2 diabetes mellitus without complications: Secondary | ICD-10-CM | POA: Diagnosis not present

## 2021-01-16 DIAGNOSIS — Z7984 Long term (current) use of oral hypoglycemic drugs: Secondary | ICD-10-CM | POA: Diagnosis not present

## 2021-01-16 DIAGNOSIS — R110 Nausea: Secondary | ICD-10-CM | POA: Diagnosis not present

## 2021-01-16 DIAGNOSIS — R509 Fever, unspecified: Secondary | ICD-10-CM | POA: Diagnosis not present

## 2021-01-16 LAB — CBC WITH DIFFERENTIAL/PLATELET
Abs Immature Granulocytes: 0.27 10*3/uL — ABNORMAL HIGH (ref 0.00–0.07)
Basophils Absolute: 0 10*3/uL (ref 0.0–0.1)
Basophils Relative: 1 %
Eosinophils Absolute: 0.1 10*3/uL (ref 0.0–0.5)
Eosinophils Relative: 2 %
HCT: 28.6 % — ABNORMAL LOW (ref 36.0–46.0)
Hemoglobin: 9.5 g/dL — ABNORMAL LOW (ref 12.0–15.0)
Immature Granulocytes: 4 %
Lymphocytes Relative: 19 %
Lymphs Abs: 1.4 10*3/uL (ref 0.7–4.0)
MCH: 27.9 pg (ref 26.0–34.0)
MCHC: 33.2 g/dL (ref 30.0–36.0)
MCV: 84.1 fL (ref 80.0–100.0)
Monocytes Absolute: 1.2 10*3/uL — ABNORMAL HIGH (ref 0.1–1.0)
Monocytes Relative: 17 %
Neutro Abs: 4.2 10*3/uL (ref 1.7–7.7)
Neutrophils Relative %: 57 %
Platelets: 175 10*3/uL (ref 150–400)
RBC: 3.4 MIL/uL — ABNORMAL LOW (ref 3.87–5.11)
RDW: 13.2 % (ref 11.5–15.5)
WBC: 7.2 10*3/uL (ref 4.0–10.5)
nRBC: 0 % (ref 0.0–0.2)

## 2021-01-16 LAB — RESP PANEL BY RT-PCR (FLU A&B, COVID) ARPGX2
Influenza A by PCR: NEGATIVE
Influenza B by PCR: NEGATIVE
SARS Coronavirus 2 by RT PCR: NEGATIVE

## 2021-01-16 LAB — COMPREHENSIVE METABOLIC PANEL
ALT: 71 U/L — ABNORMAL HIGH (ref 0–44)
AST: 36 U/L (ref 15–41)
Albumin: 3.4 g/dL — ABNORMAL LOW (ref 3.5–5.0)
Alkaline Phosphatase: 150 U/L — ABNORMAL HIGH (ref 38–126)
Anion gap: 10 (ref 5–15)
BUN: 9 mg/dL (ref 6–20)
CO2: 23 mmol/L (ref 22–32)
Calcium: 8.9 mg/dL (ref 8.9–10.3)
Chloride: 106 mmol/L (ref 98–111)
Creatinine, Ser: 0.85 mg/dL (ref 0.44–1.00)
GFR, Estimated: >60 mL/min (ref >60)
Glucose, Bld: 255 mg/dL — ABNORMAL HIGH (ref 70–99)
Potassium: 3.9 mmol/L (ref 3.5–5.1)
Sodium: 139 mmol/L (ref 135–145)
Total Bilirubin: 1.1 mg/dL (ref 0.3–1.2)
Total Protein: 7.5 g/dL (ref 6.5–8.1)

## 2021-01-16 LAB — URINALYSIS, ROUTINE W REFLEX MICROSCOPIC
Bilirubin Urine: NEGATIVE
Glucose, UA: 50 mg/dL — AB
Ketones, ur: NEGATIVE mg/dL
Nitrite: NEGATIVE
Protein, ur: 30 mg/dL — AB
Specific Gravity, Urine: 1.009 (ref 1.005–1.030)
pH: 6 (ref 5.0–8.0)

## 2021-01-16 LAB — LACTIC ACID, PLASMA
Lactic Acid, Venous: 1.8 mmol/L (ref 0.5–1.9)
Lactic Acid, Venous: 1 mmol/L (ref 0.5–1.9)

## 2021-01-16 LAB — PARASITE EXAM SCREEN, BLOOD-W CONF TO LABCORP (NOT @ ARMC)

## 2021-01-16 LAB — LIPASE, BLOOD: Lipase: 35 U/L (ref 11–51)

## 2021-01-16 MED ORDER — IOHEXOL 300 MG/ML  SOLN
100.0000 mL | Freq: Once | INTRAMUSCULAR | Status: AC | PRN
  Administered 2021-01-16: 100 mL via INTRAVENOUS

## 2021-01-16 MED ORDER — MORPHINE SULFATE (PF) 4 MG/ML IV SOLN
4.0000 mg | Freq: Once | INTRAVENOUS | Status: AC
  Administered 2021-01-16: 4 mg via INTRAVENOUS
  Filled 2021-01-16: qty 1

## 2021-01-16 MED ORDER — IBUPROFEN 800 MG PO TABS
800.0000 mg | ORAL_TABLET | Freq: Once | ORAL | Status: AC
  Administered 2021-01-16: 800 mg via ORAL
  Filled 2021-01-16: qty 1

## 2021-01-16 MED ORDER — ONDANSETRON HCL 4 MG PO TABS: 4.0000 mg | ORAL_TABLET | Freq: Three times a day (TID) | ORAL | 0 refills | Status: AC | PRN

## 2021-01-16 MED ORDER — HYDROCODONE-ACETAMINOPHEN 5-325 MG PO TABS: 1.0000 | ORAL_TABLET | ORAL | 0 refills | Status: AC | PRN

## 2021-01-16 MED ORDER — ONDANSETRON HCL 4 MG/2ML IJ SOLN
4.0000 mg | Freq: Once | INTRAMUSCULAR | Status: AC
  Administered 2021-01-16: 4 mg via INTRAVENOUS
  Filled 2021-01-16: qty 2

## 2021-01-16 NOTE — Discharge Instructions (Signed)
Your work-up today was overall reassuring thus far.  The CT scan does not show any concerning findings in her abdomen or pelvis on the ultrasound not show any gallbladder problems.  Your liver function was improved from the other day.  Your repeat Covid test and flu test were negative.  We did send 2 different malaria tests which are still in process.  Please follow-up on MyChart or with a PCP.  If they are positive, anticipate they will call you.  Please rest and stay hydrated.  Due to the discomfort he had earlier, we will give you prescription for some pain medicine and nausea medicine.  I suspect your symptoms are due to a viral infection that is not COVID or influenza as they have been going around the community recently.  If any symptoms change or worsen acutely, please return to the nearest emergency department.

## 2021-01-16 NOTE — ED Provider Notes (Signed)
7:13 AM Care assumed from Dr. Dina Rich.  At time of transfer of care, patient is awaiting for results of diagnostic work-up to further evaluate etiology of her fever.  Patient recently had travel to [place] however 2 days ago had a negative Covid test and negative malaria smear.  Patient was still having some fevers and chills that does seem to improve with Tylenol.  She is also having some mid right-sided abdominal discomfort.  Patient currently has a right upper quadrant ultrasound and labs in process.  Anticipate reassessment after work-up.  If she still having discomfort, would consider CT scan or repeat Covid/flu testing.  8:00 AM Right upper quadrant ultrasound is reassuring.  Patient does have improvement in her LFTs but her alk phos was more elevated.  Similar anemia to prior.  Urinalysis shows no nitrites but there were leukocytes and bacteria, culture was sent.  Blood cultures were also obtained.  Given patient's reassessment and she still has grimacing abdominal discomfort and chills, we will get a CT scan to look for other concerning abnormalities and etiologies.  I had a long discussion with the patient and family with the daughter and they are still extremely concerned for malaria.  We agreed to add on a repeat smear in case malaria was not detected on the previous one as well as add on a PCR.  We will also repeat a Covid and flu test.  Anticipate reassessment after work-up.   3:07 PM Patient reports feeling better.  She had the malaria test sent and will likely be called if they are positive.  Her CT scan was reassuring.  Due to the discomfort the patient was having, we will give her a prescription for pain medicine and nausea medicine.  Patient will follow up with PCP.  He understood plan of care to mother questions or concerns.  Patient discharged in good condition.  Clinical Impression: 1. Fever, unspecified fever cause   2. Abdominal pain   3. Nausea     Disposition:  Discharge  Condition: Good  I have discussed the results, Dx and Tx plan with the pt(& family if present). He/she/they expressed understanding and agree(s) with the plan. Discharge instructions discussed at great length. Strict return precautions discussed and pt &/or family have verbalized understanding of the instructions. No further questions at time of discharge.    New Prescriptions   HYDROCODONE-ACETAMINOPHEN (NORCO/VICODIN) 5-325 MG TABLET    Take 1 tablet by mouth every 4 (four) hours as needed.   ONDANSETRON (ZOFRAN) 4 MG TABLET    Take 1 tablet (4 mg total) by mouth every 8 (eight) hours as needed for nausea or vomiting.    Follow Up: Johna Roles, PA [address] [place] [address] [phone]     [facility] [place] [phone] mc [address] [place]        Caiden Arteaga, Gwenyth Allegra, MD 01/16/21 [phone]

## 2021-01-16 NOTE — ED Provider Notes (Signed)
[facility] Provider Note   CSN: [id number] Arrival date & time: 01/16/21  0545     History No chief complaint on file.   Mercedes Velasquez is a 60 y.o. female.  HPI     This is a 60 year old female with a history of diabetes who presents with her husband with continued chills, headache, body aches, right-sided abdominal pain.  Patient speaks limited English and her husband provides translation during initial history taking.  She was seen and evaluated at urgent care on 2/22.  At that time she was febrile to 102.1.  Recent travel on return from [place].  There was concern for malaria and/or Covid.  Malaria and Covid testing were negative.  She did have slight elevated LFTs and anemia.  Recommended that she follow-up with her primary physician.  Husband states she is continued to have chills.  It does seem to get better with Tylenol.  She also initiated the malaria medication prescribed by urgent care and he felt like this made her better.  However, overnight he reports that they were unable to sleep because she was so uncomfortable and continued to have chills.  Last Tylenol 1 hour ago.  She is complaining of right mid and lower abdominal pain and back pain.  She is unable to quantify her pain for me.  Denies urinary symptoms.  Has not actually taken her temperature at home but reports ongoing chills.  Denies nausea, vomiting, diarrhea.  Chart reviewed from urgent care.  She was febrile.  LFTs slightly elevated.  Past Medical History:  Diagnosis Date  . Arthritis    OA bil knees and shoulders  . Back pain   . Chronic back pain   . Diabetes mellitus without complication (HCC)   . Forearm mass, right     Patient Active Problem List   Diagnosis Date Noted  . Pap smear for cervical cancer screening 08/14/2013  . HEADACHE 07/16/2010  . PYELONEPHRITIS, HX OF 06/30/2010  . FATIGUE 12/31/2008  . HYPERCHOLESTEROLEMIA 09/03/2008  . ANEMIA 09/03/2008  .  LUMBAGO 09/03/2008  . PELVIC  PAIN 08/08/2008  . VISUAL ACUITY, DECREASED 07/04/2008  . CONSTIPATION 07/04/2008  . ANAL OR RECTAL PAIN 07/04/2008    Past Surgical History:  Procedure Laterality Date  . NO PAST SURGERIES       OB History    Gravida  5   Para  4   Term  4   Preterm      AB  1   Living  3     SAB  1   IAB      Ectopic      Multiple      Live Births              Family History  Problem Relation Age of Onset  . Other Neg Hx     Social History   Tobacco Use  . Smoking status: Never Smoker  . Smokeless tobacco: Never Used  Substance Use Topics  . Alcohol use: No  . Drug use: No    Home Medications Prior to Admission medications   Medication Sig Start Date End Date Taking? Authorizing Provider  atovaquone-proguanil (MALARONE) 250-100 MG TABS tablet Take 4 tablets by mouth daily for 3 days. 01/14/21 01/17/21  Rhys Martini, PA-C  metFORMIN (GLUCOPHAGE-XR) 500 MG 24 hr tablet TAKE 1 TABLET BY MOUTH ONCE DAILY IN THE MORNING AND 2 TABLETS ONCE DAILY IN THE EVENING 09/07/18   Dorothyann Peng, MD  ondansetron (ZOFRAN) 8 MG tablet Take 1 tablet (8 mg total) by mouth every 8 (eight) hours as needed for nausea or vomiting. 01/14/21   Rhys Martini, PA-C    Allergies    Patient has no known allergies.  Review of Systems   Review of Systems  Constitutional: Positive for chills and fever.  Respiratory: Negative for shortness of breath.   Cardiovascular: Negative for chest pain.  Gastrointestinal: Positive for abdominal pain. Negative for nausea and vomiting.  Genitourinary: Negative for dysuria.  All other systems reviewed and are negative.   Physical Exam Updated Vital Signs BP 131/84   Pulse 87   Temp (!) 100.7 F (38.2 C) (Rectal)   Resp (!) 34   LMP 10/01/2013   SpO2 97%   Physical Exam Vitals and nursing note reviewed.  Constitutional:      Appearance: She is well-developed and well-nourished. She is not ill-appearing.   HENT:     Head: Normocephalic and atraumatic.     Nose: Nose normal.     Mouth/Throat:     Mouth: Mucous membranes are dry.  Eyes:     Pupils: Pupils are equal, round, and reactive to light.  Cardiovascular:     Rate and Rhythm: Normal rate and regular rhythm.     Heart sounds: Normal heart sounds.  Pulmonary:     Effort: Pulmonary effort is normal. No respiratory distress.     Breath sounds: No wheezing.  Abdominal:     General: Bowel sounds are normal.     Palpations: Abdomen is soft.     Tenderness: There is abdominal tenderness. There is right CVA tenderness. There is no guarding or rebound.  Musculoskeletal:     Cervical back: Neck supple.  Skin:    General: Skin is warm and dry.  Neurological:     Mental Status: She is alert and oriented to person, place, and time.  Psychiatric:        Mood and Affect: Mood and affect normal.     Comments: Anxious appearing     ED Results / Procedures / Treatments   Labs (all labs ordered are listed, but only abnormal results are displayed) Labs Reviewed  CULTURE, BLOOD (ROUTINE X 2)  CULTURE, BLOOD (ROUTINE X 2)  CBC WITH DIFFERENTIAL/PLATELET  COMPREHENSIVE METABOLIC PANEL  LIPASE, BLOOD  URINALYSIS, ROUTINE W REFLEX MICROSCOPIC  LACTIC ACID, PLASMA  LACTIC ACID, PLASMA    EKG None  Radiology No results found.  Procedures Procedures   Medications Ordered in ED Medications  morphine 4 MG/ML injection 4 mg (4 mg Intravenous Given 01/16/21 0635)  ondansetron (ZOFRAN) injection 4 mg (4 mg Intravenous Given 01/16/21 2202)    ED Course  I have reviewed the triage vital signs and the nursing notes.  Pertinent labs & imaging results that were available during my care of the patient were reviewed by me and considered in my medical decision making (see chart for details).    MDM Rules/Calculators/A&P                          Patient presents with ongoing fever and chills.  Also now with abdominal pain.  Chart reviewed  and marginally elevated LFTs.  Negative malaria and Covid recently.  She is 100.7 here but otherwise nontoxic-appearing.  She is anxious appearing.  O2 sats and blood pressure are stable.  Sepsis work-up initiated.  Blood cultures as well as urine cultures were obtained.  Repeat lab  work.  Given abdominal pain, considerations include but not limited to abdominal pathology such as cholecystitis, liver abscess given recent travel, parasite infection.  Also UTI or kidney stone is a consideration.  Patient was given pain and nausea medication.  Work-up is pending at time of signout.  Final Clinical Impression(s) / ED Diagnoses Final diagnoses:  Abdominal pain    Rx / DC Orders ED Discharge Orders    None       Shon Baton, MD 01/16/21 [phone]

## 2021-01-16 NOTE — ED Triage Notes (Signed)
Pt was seen and treated two days ago for malaria symptoms and fever, her husband states they were told to come to the ED this am because something was abnormal with her liver

## 2021-01-17 LAB — PARASITE EXAM, BLOOD

## 2021-01-18 LAB — URINE CULTURE: Culture: 10000 — AB

## 2021-01-19 ENCOUNTER — Telehealth (HOSPITAL_BASED_OUTPATIENT_CLINIC_OR_DEPARTMENT_OTHER): Payer: Self-pay | Admitting: Emergency Medicine

## 2021-01-19 NOTE — Telephone Encounter (Signed)
Post ED Visit - Positive Culture Follow-up  Culture report reviewed by antimicrobial stewardship pharmacist: [facility] []  , Pharm.D. []  Enzo Bi, Pharm.D., BCPS AQ-ID []  , Pharm.D., BCPS []  Celedonio Miyamoto, Pharm.D., BCPS []  [place], Garvin Fila.D., BCPS, AAHIVP []  , Pharm.D., BCPS, AAHIVP []  Georgina Pillion, PharmD, BCPS []  , PharmD, BCPS []  [place], PharmD, BCPS []  [address], PharmD []  , PharmD, BCPS []  Estella Husk, PharmD  Pharmacy Team []  Lysle Pearl, PharmD []  , PharmD []  Phillips Climes, PharmD []  , Rph []  Agapito Games) , PharmD []  Verlan Friends, PharmD []  , PharmD []  Mervyn Gay, PharmD []  , PharmD []  Vinnie Level, PharmD [x]  [facility], PharmD []  , PharmD []  Len Childs, PharmD   Positive urine culture No further patient follow-up is required at this time.  01/19/2021, 4:17 PM

## 2021-01-20 LAB — PLASMODIUM SP. PCR: Plasmodium Sp. PCR: NEGATIVE

## 2021-01-21 LAB — CULTURE, BLOOD (ROUTINE X 2)
Culture: NO GROWTH
Culture: NO GROWTH
Special Requests: ADEQUATE

## 2022-03-03 ENCOUNTER — Other Ambulatory Visit: Payer: Self-pay | Admitting: Surgery

## 2022-03-03 DIAGNOSIS — K5900 Constipation, unspecified: Secondary | ICD-10-CM

## 2022-03-03 DIAGNOSIS — K5909 Other constipation: Secondary | ICD-10-CM

## 2022-03-03 DIAGNOSIS — K626 Ulcer of anus and rectum: Secondary | ICD-10-CM

## 2022-03-03 DIAGNOSIS — K625 Hemorrhage of anus and rectum: Secondary | ICD-10-CM

## 2022-03-03 DIAGNOSIS — K561 Intussusception: Secondary | ICD-10-CM

## 2022-03-03 DIAGNOSIS — Z789 Other specified health status: Secondary | ICD-10-CM

## 2022-03-11 ENCOUNTER — Ambulatory Visit: Payer: Self-pay | Admitting: Physical Therapy

## 2022-03-23 ENCOUNTER — Ambulatory Visit: Payer: Commercial Managed Care - HMO | Attending: Surgery | Admitting: Physical Therapy

## 2022-03-23 DIAGNOSIS — K561 Intussusception: Secondary | ICD-10-CM | POA: Insufficient documentation

## 2022-03-23 DIAGNOSIS — M62838 Other muscle spasm: Secondary | ICD-10-CM | POA: Insufficient documentation

## 2022-03-23 DIAGNOSIS — K625 Hemorrhage of anus and rectum: Secondary | ICD-10-CM | POA: Insufficient documentation

## 2022-03-23 DIAGNOSIS — K5900 Constipation, unspecified: Secondary | ICD-10-CM | POA: Insufficient documentation

## 2022-03-23 DIAGNOSIS — M6281 Muscle weakness (generalized): Secondary | ICD-10-CM | POA: Insufficient documentation

## 2022-03-23 DIAGNOSIS — K5909 Other constipation: Secondary | ICD-10-CM | POA: Insufficient documentation

## 2022-03-23 DIAGNOSIS — K626 Ulcer of anus and rectum: Secondary | ICD-10-CM | POA: Insufficient documentation

## 2022-03-24 ENCOUNTER — Ambulatory Visit: Payer: Commercial Managed Care - HMO | Admitting: Physical Therapy

## 2022-03-24 DIAGNOSIS — K626 Ulcer of anus and rectum: Secondary | ICD-10-CM | POA: Diagnosis not present

## 2022-03-24 DIAGNOSIS — M62838 Other muscle spasm: Secondary | ICD-10-CM

## 2022-03-24 DIAGNOSIS — M6281 Muscle weakness (generalized): Secondary | ICD-10-CM

## 2022-03-24 DIAGNOSIS — K561 Intussusception: Secondary | ICD-10-CM | POA: Diagnosis not present

## 2022-03-24 DIAGNOSIS — K625 Hemorrhage of anus and rectum: Secondary | ICD-10-CM | POA: Diagnosis not present

## 2022-03-24 DIAGNOSIS — K5909 Other constipation: Secondary | ICD-10-CM | POA: Diagnosis present

## 2022-03-24 DIAGNOSIS — K5900 Constipation, unspecified: Secondary | ICD-10-CM | POA: Diagnosis not present

## 2022-03-24 NOTE — Therapy (Signed)
?OUTPATIENT PHYSICAL THERAPY FEMALE PELVIC EVALUATION ? ? ?Patient Name: Mercedes Velasquez ?MRN: [id number] ?DOB:[birth year], 61 y.o., female ?Today's Date: 03/24/2022 ? ? PT End of Session - 03/24/22 1255   ? ? Visit Number 1   ? Date for PT Re-Evaluation 06/16/22   ? Authorization Type self pay   ? PT Start Time 1247   late  ? PT Stop Time 1316   ? PT Time Calculation (min) 29 min   ? Activity Tolerance Patient tolerated treatment well   ? Behavior During Therapy [facility] for tasks assessed/performed   ? ?  ?  ? ?  ? ? ?Past Medical History:  ?Diagnosis Date  ? Arthritis   ? OA bil knees and shoulders  ? Back pain   ? Chronic back pain   ? Diabetes mellitus without complication ([place])   ? Forearm mass, right   ? ?Past Surgical History:  ?Procedure Laterality Date  ? NO PAST SURGERIES    ? ?Patient Active Problem List  ? Diagnosis Date Noted  ? Pap smear for cervical cancer screening 08/14/2013  ? HEADACHE 07/16/2010  ? PYELONEPHRITIS, HX OF 06/30/2010  ? FATIGUE 12/31/2008  ? HYPERCHOLESTEROLEMIA 09/03/2008  ? ANEMIA 09/03/2008  ? LUMBAGO 09/03/2008  ? PELVIC  PAIN 08/08/2008  ? VISUAL ACUITY, DECREASED 07/04/2008  ? CONSTIPATION 07/04/2008  ? ANAL OR RECTAL PAIN 07/04/2008  ? ? ?PCP: Johna Roles, PA ? ?REFERRING PROVIDER: Michael Boston, MD ? ?REFERRING DIAG:  ?K59.09 (ICD-10-CM) - Other constipation  ?K59.00 (ICD-10-CM) - Constipation, unspecified  ?K56.1 (ICD-10-CM) - Intussusception  ?K62.6 (ICD-10-CM) - Ulcer of anus and rectum  ?K62.5 (ICD-10-CM) - Hemorrhage of anus and rectum  ? ? ?THERAPY DIAG:  ?Other muscle spasm ? ?Muscle weakness (generalized) ? ?ONSET DATE: 15 years ? ?SUBJECTIVE:                                                                                                                                                                                          ? ?SUBJECTIVE STATEMENT: ?I have back pain and pain with BM. The constipation had for 15 years, the bleeding since taking tylenol or pain pills  for the pain, have to go to the bathroom many times ?Fluid intake: a lot water ? ?Patient confirms identification and approves PT to assess pelvic floor and treatment Yes ? ? ?PAIN:  ?Are you having pain? Yes ?NPRS scale: 5/10 ?Pain location: low back ? ?Pain type: aching ?Pain description: intermittent  ? ?Aggravating factors: when having a BM ?Relieving factors:  ? ?PRECAUTIONS: None ? ?WEIGHT BEARING RESTRICTIONS No ? ?FALLS:  ?Has patient fallen in last 6 months?  No ? ?LIVING ENVIRONMENT: ?Lives with: lives with their family, lives with their spouse, and lives with their son ?Lives in: House/apartment ? ? ?OCCUPATION: no ? ?PLOF: Independent ? ?PATIENT GOALS stop having so much straining ? ?PERTINENT HISTORY:  ?4 vaginal deliveries, tearing during delivery ?Sexual abuse: No ? ?BOWEL MOVEMENT ?Pain with bowel movement: Yes ?Type of bowel movement:Type (Bristol Stool Scale) normal with mirilax, Frequency several times back and forth, and Strain Yes (better with mirilax) ?Fully empty rectum: No ?Leakage: No ?Pads: No ?Fiber supplement: No ? ?URINATION ?Pain with urination: No ? ? ?INTERCOURSE ?Not assessed ? ?PREGNANCY ?Vaginal deliveries 4 ?Tearing Yes: with first child ? ? ?PROLAPSE ?None ? ? ? ?OBJECTIVE:  ? ? ?COGNITION: ? Overall cognitive status: Within functional limits for tasks assessed   ? ? ?MUSCLE LENGTH: ?Hamstrings: Right WFL deg; Left WFL deg ? ? ?GAIT: ? ?Comments: WFL ? ?POSTURE:  ?Lumbar lordosis and thoracic kyphosis ? ?LUMBARAROM/PROM ? ?A/PROM A/PROM  ?03/24/2022  ?Flexion 80%  ?Extension WFL  ?Right lateral flexion WFL  ?Left lateral flexion WFL  ?Right rotation 80%  ?Left rotation WFL  ? (Blank rows = not tested) ? ?LE ROM: ? ?Passive ROM Right ?03/24/2022 Left ?03/24/2022  ?Hip flexion 80% WFL  ?Hip extension    ?Hip abduction    ?Hip adduction    ?Hip internal rotation 75% WFL  ?Hip external rotation 75% WFL  ?Knee flexion    ?Knee extension    ?Ankle dorsiflexion    ?Ankle plantarflexion     ?Ankle inversion    ?Ankle eversion    ? (Blank rows = not tested) ? ?LE MMT: ? ?MMT Right ?03/24/2022 Left ?03/24/2022  ?Hip flexion 5/5 5/5  ?Hip extension    ?Hip abduction 4/5 5/5  ?Hip adduction 4+/5 4/5  ?Hip internal rotation    ?Hip external rotation    ?Knee flexion    ?Knee extension    ?Ankle dorsiflexion    ?Ankle plantarflexion    ?Ankle inversion    ?Ankle eversion    ? ?PELVIC MMT: ?  ?MMT  ?03/24/2022  ?Vaginal   ?Internal Anal Sphincter Deferred due to time  ?External Anal Sphincter   ?Puborectalis Deferred due to time  ?Diastasis Recti   ?(Blank rows = not tested) ? ?      PALPATION: ?  General  lumbar and thoracic paraspinals tight - Lt side more tension in low thoracic region ? ?              External Perineal Exam deferred due to time ?              ?              Internal Pelvic Floor deferred due to time ? ?TONE: ?deferred due to time ? ?PROLAPSE: ?Deferred due to time ? ?TODAY'S TREATMENT  ?EVAL tx Deferred due to time ? ? ?PATIENT EDUCATION:  ?Education details: explained toileting techniques ?Person educated: Patient and daughter (interpreting) ?Education method: Explanation, Demonstration, and Handouts ?Education comprehension: verbalized understanding ? ? ?HOME EXERCISE PROGRAM: ?Access Code: [email or web address] ?URL: [email or web address] ?Date: 03/24/2022 ?Prepared by: Jari Favre ? ?Patient Education ?- Trigger Point Dry Needling ? ?ASSESSMENT: ? ?CLINICAL IMPRESSION: ?Patient is a 61 y.o. female who was seen today for physical therapy evaluation and treatment for constipation and pain.  Pt has had this issue chronically for 15 years.  Pt has tension throughout pelvic region.  Pt arrived late and due to time was unable to perform  internal assessment.  Pt will benefit from skilled PT to address all above mentioned impairments and return to more normal defecation for reduced stress on soft tissues and reduced rectal bleeding. ? ? ?OBJECTIVE IMPAIRMENTS decreased coordination,  decreased ROM, decreased strength, increased fascial restrictions, increased muscle spasms, impaired flexibility, postural dysfunction, and pain.  ? ?ACTIVITY LIMITATIONS  toileting .  ? ?PERSONAL FACTORS Time since onset of injury/illness/exacerbation and 1 comorbidity: 4 vaginal deliveries  are also affecting patient's functional outcome.  ? ? ?REHAB POTENTIAL: Good ? ?CLINICAL DECISION MAKING: Evolving/moderate complexity ? ?EVALUATION COMPLEXITY: Moderate ? ? ?GOALS: ?Goals reviewed with patient? Yes ? ?SHORT TERM GOALS: Target date: 04/21/2022 ? ?Ind with toileting techniques ?Baseline: ?Goal status: INITIAL ? ?2.  Ind with initial HEP ?Baseline:  ?Goal status: INITIAL ? ? ?LONG TERM GOALS: Target date: 06/16/2022 ? ?Pt will be independent with advanced HEP to maintain improvements made throughout therapy ? ?Baseline:  ?Goal status: INITIAL ? ?2.  Pt will report her BMs are complete due to improved bowel habits and evacuation techniques.  ?Baseline:  ?Goal status: INITIAL ? ?3.  Pt will report 5 BMs per week due to improved muscle tone and coordination with BMs.  ?Baseline:  ?Goal status: INITIAL ? ?4.  Pt will report 50% reduction of pain due to improvements in posture, strength, and muscle length ? ?Baseline:  ?Goal status: INITIAL ? ? ?PLAN: ?PT FREQUENCY: 1x/week ? ?PT DURATION: 12 weeks ? ?PLANNED INTERVENTIONS: Therapeutic exercises, Therapeutic activity, Neuromuscular re-education, Balance training, Gait training, Patient/Family education, Joint mobilization, Dry Needling, Electrical stimulation, Cryotherapy, Moist heat, Taping, Biofeedback, and Manual therapy ? ?PLAN FOR NEXT SESSION: lumbar STM and DN; pelvic rectal assess and treat as needed, review toileting, add stretches and breathing ? ? ?Jule Ser, PT ?03/24/2022, 1:59 PM ? ?

## 2022-03-30 NOTE — Therapy (Signed)
?OUTPATIENT PHYSICAL THERAPY TREATMENT NOTE ? ? ?Patient Name: Mercedes Velasquez ?MRN: [id number] ?DOB:02/28/1961, 61 y.o., female ?Today's Date: 03/30/2022 ? ?PCP: Delma Officer, PA ?  ?REFERRING PROVIDER: Karie Soda, MD ? ?END OF SESSION:  ? ? ?Past Medical History:  ?Diagnosis Date  ? Arthritis   ? OA bil knees and shoulders  ? Back pain   ? Chronic back pain   ? Diabetes mellitus without complication ([facility])   ? Forearm mass, right   ? ?Past Surgical History:  ?Procedure Laterality Date  ? NO PAST SURGERIES    ? ?Patient Active Problem List  ? Diagnosis Date Noted  ? Pap smear for cervical cancer screening 08/14/2013  ? HEADACHE 07/16/2010  ? PYELONEPHRITIS, HX OF 06/30/2010  ? FATIGUE 12/31/2008  ? HYPERCHOLESTEROLEMIA 09/03/2008  ? ANEMIA 09/03/2008  ? LUMBAGO 09/03/2008  ? PELVIC  PAIN 08/08/2008  ? VISUAL ACUITY, DECREASED 07/04/2008  ? CONSTIPATION 07/04/2008  ? ANAL OR RECTAL PAIN 07/04/2008  ? ? ?REFERRING DIAG:  ?K59.09 (ICD-10-CM) - Other constipation  ?K59.00 (ICD-10-CM) - Constipation, unspecified  ?K56.1 (ICD-10-CM) - Intussusception  ?K62.6 (ICD-10-CM) - Ulcer of anus and rectum  ?K62.5 (ICD-10-CM) - Hemorrhage of anus and rectum  ? ? ?THERAPY DIAG:  ?No diagnosis found. ? ?PERTINENT HISTORY: 4 vaginal deliveries, tearing during delivery ? ?PRECAUTIONS: None ? ?SUBJECTIVE: Pt reports the toilet technique was helpful ? ?PAIN:  ?Are you having pain? No ? ? ?OBJECTIVE: (objective measures completed at initial evaluation unless otherwise dated) ? ?MUSCLE LENGTH: ?Hamstrings: Right WFL deg; Left WFL deg ?  ?  ?GAIT: ?  ?Comments: WFL ?  ?POSTURE:  ?Lumbar lordosis and thoracic kyphosis ?  ?LUMBARAROM/PROM ?  ?A/PROM A/PROM  ?03/24/2022  ?Flexion 80%  ?Extension WFL  ?Right lateral flexion WFL  ?Left lateral flexion WFL  ?Right rotation 80%  ?Left rotation WFL  ? (Blank rows = not tested) ?  ?LE ROM: ?  ?Passive ROM Right ?03/24/2022 Left ?03/24/2022  ?Hip flexion 80% WFL  ?Hip extension      ?Hip abduction       ?Hip adduction      ?Hip internal rotation 75% WFL  ?Hip external rotation 75% WFL  ?Knee flexion      ?Knee extension      ?Ankle dorsiflexion      ?Ankle plantarflexion      ?Ankle inversion      ?Ankle eversion      ? (Blank rows = not tested) ?  ?LE MMT: ?  ?MMT Right ?03/24/2022 Left ?03/24/2022  ?Hip flexion 5/5 5/5  ?Hip extension      ?Hip abduction 4/5 5/5  ?Hip adduction 4+/5 4/5  ?Hip internal rotation      ?Hip external rotation      ?Knee flexion      ?Knee extension      ?Ankle dorsiflexion      ?Ankle plantarflexion      ?Ankle inversion      ?Ankle eversion      ?  ?PELVIC MMT: ?  ?MMT   ?03/24/2022  ?Vaginal    ?Internal Anal Sphincter Deferred due to time  ?External Anal Sphincter    ?Puborectalis Deferred due to time  ?Diastasis Recti    ?(Blank rows = not tested) ?  ?      PALPATION: ?  General  lumbar and thoracic paraspinals tight - Lt side more tension in low thoracic region ?  ?  External Perineal Exam deferred due to time ?              ?              Internal Pelvic Floor deferred due to time ?  ?TONE: ?deferred due to time ?  ?PROLAPSE: ?Deferred due to time ?  ?TODAY'S TREATMENT  ?Treatment: 03/31/22 ?Exercises  ?SKTC ?Piriformis in supine ? ?Manual ?Lumbar paraspinals, low thoracic , gluteal ? ? ? ? ?  ?  ? ?PATIENT EDUCATION: ?Education details: Access Code: [email or web address] ?Person educated: Patient ?Education method: Explanation, Demonstration, Tactile cues, Verbal cues, and Handouts ?Education comprehension: verbalized understanding ? ? ?  ?  ?HOME EXERCISE PROGRAM: ?Access Code: [email or web address] ?URL: [email or web address] ?Date: 03/31/2022 ?Prepared by: Dwana Curd ? ?Exercises ?- Supine Figure 4 Piriformis Stretch  - 1 x daily - 7 x weekly - 1 sets - 3 reps - 30 sec hold ?- Supine Single Knee to Chest Stretch  - 2 x daily - 7 x weekly - 1 sets - 5 reps - 10 sec hold ? ?Patient Education ?- Trigger Point Dry Needling ?  ?ASSESSMENT: ?  ?CLINICAL IMPRESSION: ?Patient  arrived late and interpreter was unavailable.  Pt called husband to interpret.  Pt did well with STM was very TTP in lumbar and gluteals.  Thoracic paraspinals very tight but not tender.  Pt felt a good stretch with SKTC and piriformis and was given stretches to continue to address tension seen in today's treatment. Pt will benefit from skilled PT to address all above mentioned impairments and return to more normal defecation for reduced stress on soft tissues and reduced rectal bleeding. ?  ?  ?OBJECTIVE IMPAIRMENTS decreased coordination, decreased ROM, decreased strength, increased fascial restrictions, increased muscle spasms, impaired flexibility, postural dysfunction, and pain.  ?  ?ACTIVITY LIMITATIONS  toileting .  ?  ?PERSONAL FACTORS Time since onset of injury/illness/exacerbation and 1 comorbidity: 4 vaginal deliveries  are also affecting patient's functional outcome.  ?  ?  ?REHAB POTENTIAL: Good ?  ?CLINICAL DECISION MAKING: Evolving/moderate complexity ?  ?EVALUATION COMPLEXITY: Moderate ?  ?  ?GOALS: ?Goals reviewed with patient? Yes ?  ?SHORT TERM GOALS: Target date: 04/21/2022 ?  ?Ind with toileting techniques ?Baseline: ?Goal status: INITIAL ?  ?2.  Ind with initial HEP ?Baseline:  ?Goal status: INITIAL ?  ?  ?LONG TERM GOALS: Target date: 06/16/2022 ?  ?Pt will be independent with advanced HEP to maintain improvements made throughout therapy ?  ?Baseline:  ?Goal status: INITIAL ?  ?2.  Pt will report her BMs are complete due to improved bowel habits and evacuation techniques.  ?Baseline:  ?Goal status: INITIAL ?  ?3.  Pt will report 5 BMs per week due to improved muscle tone and coordination with BMs.  ?Baseline:  ?Goal status: INITIAL ?  ?4.  Pt will report 50% reduction of pain due to improvements in posture, strength, and muscle length ?  ?Baseline:  ?Goal status: INITIAL ?  ?  ?PLAN: ?PT FREQUENCY: 1x/week ?  ?PT DURATION: 12 weeks ?  ?PLANNED INTERVENTIONS: Therapeutic exercises, Therapeutic  activity, Neuromuscular re-education, Balance training, Gait training, Patient/Family education, Joint mobilization, Dry Needling, Electrical stimulation, Cryotherapy, Moist heat, Taping, Biofeedback, and Manual therapy ?  ?PLAN FOR NEXT SESSION: lumbar STM and DN; pelvic rectal assess and treat and discuss self massage as needed ?  ?  ? ?Junious Silk, PT ?03/30/2022, 9:16 PM ? ?  ? ?

## 2022-03-31 ENCOUNTER — Ambulatory Visit: Payer: Commercial Managed Care - HMO | Admitting: Physical Therapy

## 2022-03-31 ENCOUNTER — Encounter: Payer: Self-pay | Admitting: Physical Therapy

## 2022-03-31 DIAGNOSIS — M6281 Muscle weakness (generalized): Secondary | ICD-10-CM

## 2022-03-31 DIAGNOSIS — M62838 Other muscle spasm: Secondary | ICD-10-CM

## 2022-03-31 DIAGNOSIS — K5909 Other constipation: Secondary | ICD-10-CM | POA: Diagnosis not present

## 2022-04-07 ENCOUNTER — Encounter: Payer: Self-pay | Admitting: Physical Therapy

## 2022-04-07 ENCOUNTER — Ambulatory Visit: Payer: Commercial Managed Care - HMO | Admitting: Physical Therapy

## 2022-04-07 DIAGNOSIS — M62838 Other muscle spasm: Secondary | ICD-10-CM

## 2022-04-07 DIAGNOSIS — K5909 Other constipation: Secondary | ICD-10-CM | POA: Diagnosis not present

## 2022-04-07 DIAGNOSIS — M6281 Muscle weakness (generalized): Secondary | ICD-10-CM

## 2022-04-07 NOTE — Therapy (Signed)
?OUTPATIENT PHYSICAL THERAPY TREATMENT NOTE ? ? ?Patient Name: Mercedes Velasquez ?MRN: [id number] ?DOB:1961-01-18, 61 y.o., female ?Today's Date: 04/07/2022 ? ?PCP: Johna Roles, PA ?  ?REFERRING PROVIDER: Michael Boston, MD ? ?END OF SESSION:  ? PT End of Session - 04/07/22 0857   ? ? Visit Number 3   ? Date for PT Re-Evaluation 06/16/22   ? Authorization Type self pay   ? PT Start Time [phone]   late  ? PT Stop Time 1191   ? PT Time Calculation (min) 39 min   ? Activity Tolerance Patient tolerated treatment well   ? Behavior During Therapy [facility] for tasks assessed/performed   ? ?  ?  ? ?  ? ? ?Past Medical History:  ?Diagnosis Date  ? Arthritis   ? OA bil knees and shoulders  ? Back pain   ? Chronic back pain   ? Diabetes mellitus without complication ([place])   ? Forearm mass, right   ? ?Past Surgical History:  ?Procedure Laterality Date  ? NO PAST SURGERIES    ? ?Patient Active Problem List  ? Diagnosis Date Noted  ? Pap smear for cervical cancer screening 08/14/2013  ? HEADACHE 07/16/2010  ? PYELONEPHRITIS, HX OF 06/30/2010  ? FATIGUE 12/31/2008  ? HYPERCHOLESTEROLEMIA 09/03/2008  ? ANEMIA 09/03/2008  ? LUMBAGO 09/03/2008  ? PELVIC  PAIN 08/08/2008  ? VISUAL ACUITY, DECREASED 07/04/2008  ? CONSTIPATION 07/04/2008  ? ANAL OR RECTAL PAIN 07/04/2008  ? ? ?REFERRING DIAG:  ?K59.09 (ICD-10-CM) - Other constipation  ?K59.00 (ICD-10-CM) - Constipation, unspecified  ?K56.1 (ICD-10-CM) - Intussusception  ?K62.6 (ICD-10-CM) - Ulcer of anus and rectum  ?K62.5 (ICD-10-CM) - Hemorrhage of anus and rectum  ? ? ?THERAPY DIAG:  ?Other muscle spasm ? ?Muscle weakness (generalized) ? ?PERTINENT HISTORY: 4 vaginal deliveries, tearing during delivery ? ?PRECAUTIONS: None ? ?SUBJECTIVE: Pt reports still straining during BM.  Patient confirms identification and approves physical therapist to perform internal soft tissue work and assess rectally today ? ? ?PAIN:  ?Are you having pain? No ? ? ?OBJECTIVE: (objective measures completed at  initial evaluation unless otherwise dated) ? ?MUSCLE LENGTH: ?Hamstrings: Right WFL deg; Left WFL deg ?  ?  ?GAIT: ?  ?Comments: WFL ?  ?POSTURE:  ?Lumbar lordosis and thoracic kyphosis ?  ?LUMBARAROM/PROM ?  ?A/PROM A/PROM  ?03/24/2022  ?Flexion 80%  ?Extension WFL  ?Right lateral flexion WFL  ?Left lateral flexion WFL  ?Right rotation 80%  ?Left rotation WFL  ? (Blank rows = not tested) ?  ?LE ROM: ?  ?Passive ROM Right ?03/24/2022 Left ?03/24/2022  ?Hip flexion 80% WFL  ?Hip extension      ?Hip abduction      ?Hip adduction      ?Hip internal rotation 75% WFL  ?Hip external rotation 75% WFL  ?Knee flexion      ?Knee extension      ?Ankle dorsiflexion      ?Ankle plantarflexion      ?Ankle inversion      ?Ankle eversion      ? (Blank rows = not tested) ?  ?LE MMT: ?  ?MMT Right ?03/24/2022 Left ?03/24/2022  ?Hip flexion 5/5 5/5  ?Hip extension      ?Hip abduction 4/5 5/5  ?Hip adduction 4+/5 4/5  ?Hip internal rotation      ?Hip external rotation      ?Knee flexion      ?Knee extension      ?Ankle dorsiflexion      ?  Ankle plantarflexion      ?Ankle inversion      ?Ankle eversion      ?  ?PELVIC MMT: ?  ?MMT   ?04/07/22 ?  ?Vaginal    ?Internal Anal Sphincter   ?External Anal Sphincter    ?Puborectalis   ?Diastasis Recti    ?(Blank rows = not tested) ?  ?      PALPATION: ?  General  lumbar and thoracic paraspinals tight - Lt side more tension in low thoracic region ?  ?              External Perineal Exam deferred due to time ?              ?              Internal Pelvic Floor deferred due to time ?  ?TONE: ?deferred due to time ?  ?PROLAPSE: ?Deferred due to time ?  ?TODAY'S TREATMENT  ?Treatment: 04/07/22 ? ?Self care ?Tennis ball massage to gluteals educated and demo ?Review toileting and ways to avoid straining - position changes ? ?Exercises  ? ?Piriformis in sitting ? ?Manual No emotional/communication barriers or cognitive limitation. Patient is motivated to learn. Patient understands and agrees with treatment goals  and plan. PT explains patient will be examined in standing, sitting, and lying down to see how their muscles and joints work. When they are ready, they will be asked to remove their underwear so PT can examine their perineum. The patient is also given the option of providing their own chaperone as one is not provided in our facility. The patient also has the right and is explained the right to defer or refuse any part of the evaluation or treatment including the internal exam. With the patient's consent, PT will use one gloved finger to gently assess the muscles of the pelvic floor, seeing how well it contracts and relaxes and if there is muscle symmetry. After, the patient will get dressed and PT and patient will discuss exam findings and plan of care. PT and patient discuss plan of care, schedule, attendance policy and HEP activities. ? ?Lumbar paraspinals, low thoracic , gluteal ?STM to levators bil - internal ? ? ?Treatment: 03/31/22 ?Exercises  ?SKTC ?Piriformis in supine ? ?Manual ?Lumbar paraspinals, low thoracic , gluteal ? ? ? ? ?  ?  ? ?PATIENT EDUCATION: ?Education details: Access Code: [email or web address] ?Person educated: Patient ?Education method: Explanation, Demonstration, Tactile cues, Verbal cues, and Handouts ?Education comprehension: verbalized understanding ? ? ?  ?  ?HOME EXERCISE PROGRAM: ?Access Code: [email or web address] ?URL: [email or web address] ?Date: 03/31/2022 ?Prepared by: Jari Favre ? ?Exercises ?- Supine Figure 4 Piriformis Stretch  - 1 x daily - 7 x weekly - 1 sets - 3 reps - 30 sec hold ?- Supine Single Knee to Chest Stretch  - 2 x daily - 7 x weekly - 1 sets - 5 reps - 10 sec hold ? ?Patient Education ?- Trigger Point Dry Needling ?  ?ASSESSMENT: ?  ?CLINICAL IMPRESSION: ?Patient had her son on the phone to interpret.  Pt tolerated internal assessment rectally but very tender on the Rt levators and OI.  Pt tolerated guteal soft tissue work to Rt side as well  Very tender but  released during session.  Pt given tennis ball massage to work on STM at home.  Review of HEP as well.  Pt will benefit from skilled PT to address all above mentioned impairments and return to more normal defecation for reduced  stress on soft tissues and reduced rectal bleeding. ?  ?  ?OBJECTIVE IMPAIRMENTS decreased coordination, decreased ROM, decreased strength, increased fascial restrictions, increased muscle spasms, impaired flexibility, postural dysfunction, and pain.  ?  ?ACTIVITY LIMITATIONS  toileting .  ?  ?PERSONAL FACTORS Time since onset of injury/illness/exacerbation and 1 comorbidity: 4 vaginal deliveries  are also affecting patient's functional outcome.  ?  ?  ?REHAB POTENTIAL: Good ?  ?CLINICAL DECISION MAKING: Evolving/moderate complexity ?  ?EVALUATION COMPLEXITY: Moderate ?  ?  ?GOALS: ?Goals reviewed with patient? Yes ?  ?SHORT TERM GOALS: Target date: 04/21/2022 ?  ?Ind with toileting techniques ?Baseline:04/07/22 ? ?Goal status: met ?  ?2.  Ind with initial HEP ?Baseline: 04/07/22 ? ?Goal status: Met ?  ?  ?LONG TERM GOALS: Target date: 06/16/2022 ?  ?Pt will be independent with advanced HEP to maintain improvements made throughout therapy ?  ?Baseline:  ?Goal status: INITIAL ?  ?2.  Pt will report her BMs are complete due to improved bowel habits and evacuation techniques.  ?Baseline:  ?Goal status: INITIAL ?  ?3.  Pt will report 5 BMs per week due to improved muscle tone and coordination with BMs.  ?Baseline:  ?Goal status: INITIAL ?  ?4.  Pt will report 50% reduction of pain due to improvements in posture, strength, and muscle length ?  ?Baseline:  ?Goal status: INITIAL ?  ?  ?PLAN: ?PT FREQUENCY: 1x/week ?  ?PT DURATION: 12 weeks ?  ?PLANNED INTERVENTIONS: Therapeutic exercises, Therapeutic activity, Neuromuscular re-education, Balance training, Gait training, Patient/Family education, Joint mobilization, Dry Needling, Electrical stimulation, Cryotherapy, Moist heat, Taping, Biofeedback, and  Manual therapy ?  ?PLAN FOR NEXT SESSION: lumbar and Rt gluteal STM and DN; continue work on breathing and relaxing pelvic release and gluteal release, cat cow or modified ?  ?  ? ?Jule Ser, PT ?5/16/

## 2022-04-13 NOTE — Therapy (Unsigned)
OUTPATIENT PHYSICAL THERAPY TREATMENT NOTE   Patient Name: Mercedes Velasquez MRN: [id number] DOB:01-22-1961, 61 y.o., female Today's Date: 04/14/2022  PCP: Johna Roles, PA   REFERRING PROVIDER: Michael Boston, MD  END OF SESSION:   PT End of Session - 04/14/22 0852     Visit Number 4    Date for PT Re-Evaluation 06/16/22    Authorization Type self pay    PT Start Time 0850    PT Stop Time 0930    PT Time Calculation (min) 40 min    Activity Tolerance Patient tolerated treatment well    Behavior During Therapy [facility] for tasks assessed/performed              Past Medical History:  Diagnosis Date   Arthritis    OA bil knees and shoulders   Back pain    Chronic back pain    Diabetes mellitus without complication (HCC)    Forearm mass, right    Past Surgical History:  Procedure Laterality Date   NO PAST SURGERIES     Patient Active Problem List   Diagnosis Date Noted   Pap smear for cervical cancer screening 08/14/2013   HEADACHE 07/16/2010   PYELONEPHRITIS, HX OF 06/30/2010   FATIGUE 12/31/2008   HYPERCHOLESTEROLEMIA 09/03/2008   ANEMIA 09/03/2008   LUMBAGO 09/03/2008   PELVIC  PAIN 08/08/2008   VISUAL ACUITY, DECREASED 07/04/2008   CONSTIPATION 07/04/2008   ANAL OR RECTAL PAIN 07/04/2008    REFERRING DIAG:  K59.09 (ICD-10-CM) - Other constipation  K59.00 (ICD-10-CM) - Constipation, unspecified  K56.1 (ICD-10-CM) - Intussusception  K62.6 (ICD-10-CM) - Ulcer of anus and rectum  K62.5 (ICD-10-CM) - Hemorrhage of anus and rectum    THERAPY DIAG:  Other muscle spasm  Muscle weakness (generalized)  PERTINENT HISTORY: 4 vaginal deliveries, tearing during delivery  PRECAUTIONS: None  SUBJECTIVE: Pt has anus pain that is severe during the BM.  The stool is not hard, but I push and push to get it out and sometimes have to use my finger to get it out. Low back feels better today.     PAIN:  Are you having pain? No   OBJECTIVE: (objective measures  completed at initial evaluation unless otherwise dated)  MUSCLE LENGTH: Hamstrings: Right WFL deg; Left WFL deg     GAIT:   Comments: WFL   POSTURE:  Lumbar lordosis and thoracic kyphosis   LUMBARAROM/PROM   A/PROM A/PROM  03/24/2022  Flexion 80%  Extension WFL  Right lateral flexion WFL  Left lateral flexion WFL  Right rotation 80%  Left rotation WFL   (Blank rows = not tested)   LE ROM:   Passive ROM Right 03/24/2022 Left 03/24/2022  Hip flexion 80% [facility]  Hip extension      Hip abduction      Hip adduction      Hip internal rotation 75% WFL  Hip external rotation 75% [facility]  Knee flexion      Knee extension      Ankle dorsiflexion      Ankle plantarflexion      Ankle inversion      Ankle eversion       (Blank rows = not tested)   LE MMT:   MMT Right 03/24/2022 Left 03/24/2022  Hip flexion 5/5 5/5  Hip extension      Hip abduction 4/5 5/5  Hip adduction 4+/5 4/5  Hip internal rotation      Hip external rotation      Knee  flexion      Knee extension      Ankle dorsiflexion      Ankle plantarflexion      Ankle inversion      Ankle eversion        PELVIC MMT:   MMT   04/07/22   Vaginal    Internal Anal Sphincter   External Anal Sphincter    Puborectalis   Diastasis Recti    (Blank rows = not tested)         PALPATION:   General  lumbar and thoracic paraspinals tight - Lt side more tension in low thoracic region                 External Perineal Exam deferred due to time                             Internal Pelvic Floor deferred due to time   TONE: deferred due to time   PROLAPSE: Deferred due to time   TODAY'S TREATMENT  Treatment: 04/14/22 Self care: educated on Sitz bath  Manual Lumbar, thoracic parapsinals; gluteals bil Trigger Point Dry-Needling  Treatment instructions: Expect mild to moderate muscle soreness. S/S of pneumothorax if dry needled over a lung field, and to seek immediate medical attention should they occur. Patient  verbalized understanding of these instructions and education.  Patient Consent Given: Yes Education handout provided: Yes Muscles treated: lumbar erector spinea Electrical stimulation performed: No Parameters: N/A Treatment response/outcome: increased soft tissue length  Treatment: 04/07/22  Self care Tennis ball massage to gluteals educated and demo Review toileting and ways to avoid straining - position changes  Exercises   Piriformis in sitting  Manual No emotional/communication barriers or cognitive limitation. Patient is motivated to learn. Patient understands and agrees with treatment goals and plan. PT explains patient will be examined in standing, sitting, and lying down to see how their muscles and joints work. When they are ready, they will be asked to remove their underwear so PT can examine their perineum. The patient is also given the option of providing their own chaperone as one is not provided in our facility. The patient also has the right and is explained the right to defer or refuse any part of the evaluation or treatment including the internal exam. With the patient's consent, PT will use one gloved finger to gently assess the muscles of the pelvic floor, seeing how well it contracts and relaxes and if there is muscle symmetry. After, the patient will get dressed and PT and patient will discuss exam findings and plan of care. PT and patient discuss plan of care, schedule, attendance policy and HEP activities.  Lumbar paraspinals, low thoracic , gluteal STM to levators bil - internal     PATIENT EDUCATION: Education details: Access Code: [email or web address] Person educated: Patient Education method: Explanation, Demonstration, Tactile cues, Verbal cues, and Handouts Education comprehension: verbalized understanding       HOME EXERCISE PROGRAM: Access Code: [email or web address] URL: [email or web address] Date: 03/31/2022 Prepared by: Jari Favre  Exercises -  Supine Figure 4 Piriformis Stretch  - 1 x daily - 7 x weekly - 1 sets - 3 reps - 30 sec hold - Supine Single Knee to Chest Stretch  - 2 x daily - 7 x weekly - 1 sets - 5 reps - 10 sec hold  Patient Education - Trigger Point Dry Needling   ASSESSMENT:   CLINICAL IMPRESSION: Patient had her daughter  on the phone to interpret today.  Pt is still reporting anal pain and a lot of straining to have BM.  Discussed with pt that she should only push as detailed on the handout and avoid straining.  Pt tolerated dry needling well but only with very gentle movements of needle.  Pt still very inflamed throughout the gluteals and lumbar regions.  Dry needling was only performed on low back due to pain.  Pt will benefit from skilled PT to address all above mentioned impairments and return to more normal defecation for reduced stress on soft tissues and reduced rectal bleeding.     OBJECTIVE IMPAIRMENTS decreased coordination, decreased ROM, decreased strength, increased fascial restrictions, increased muscle spasms, impaired flexibility, postural dysfunction, and pain.    ACTIVITY LIMITATIONS  toileting .    PERSONAL FACTORS Time since onset of injury/illness/exacerbation and 1 comorbidity: 4 vaginal deliveries  are also affecting patient's functional outcome.      REHAB POTENTIAL: Good   CLINICAL DECISION MAKING: Evolving/moderate complexity   EVALUATION COMPLEXITY: Moderate     GOALS: Goals reviewed with patient? Yes   SHORT TERM GOALS: Target date: 04/21/2022   Ind with toileting techniques Baseline:04/07/22  Goal status: met   2.  Ind with initial HEP Baseline: 04/07/22  Goal status: Met     LONG TERM GOALS: Target date: 06/16/2022   Pt will be independent with advanced HEP to maintain improvements made throughout therapy   Baseline:  Goal status: INITIAL   2.  Pt will report her BMs are complete due to improved bowel habits and evacuation techniques.  Baseline:  Goal status:  INITIAL   3.  Pt will report 5 BMs per week due to improved muscle tone and coordination with BMs.  Baseline:  Goal status: INITIAL   4.  Pt will report 50% reduction of pain due to improvements in posture, strength, and muscle length   Baseline:  Goal status: INITIAL     PLAN: PT FREQUENCY: 1x/week   PT DURATION: 12 weeks   PLANNED INTERVENTIONS: Therapeutic exercises, Therapeutic activity, Neuromuscular re-education, Balance training, Gait training, Patient/Family education, Joint mobilization, Dry Needling, Electrical stimulation, Cryotherapy, Moist heat, Taping, Biofeedback, and Manual therapy   PLAN FOR NEXT SESSION: lumbar and Rt gluteal STM and DN; continue work on breathing and relaxing pelvic release and gluteal release, cat cow or modified      [employer], PT 04/14/2022, 10:29 AM

## 2022-04-14 ENCOUNTER — Encounter: Payer: Self-pay | Admitting: Physical Therapy

## 2022-04-14 ENCOUNTER — Ambulatory Visit: Payer: Commercial Managed Care - HMO | Admitting: Physical Therapy

## 2022-04-14 DIAGNOSIS — M6281 Muscle weakness (generalized): Secondary | ICD-10-CM

## 2022-04-14 DIAGNOSIS — K5909 Other constipation: Secondary | ICD-10-CM | POA: Diagnosis not present

## 2022-04-14 DIAGNOSIS — M62838 Other muscle spasm: Secondary | ICD-10-CM

## 2022-04-21 ENCOUNTER — Other Ambulatory Visit: Payer: Commercial Managed Care - HMO

## 2022-04-27 NOTE — Therapy (Addendum)
OUTPATIENT PHYSICAL THERAPY TREATMENT NOTE   Patient Name: Mercedes Velasquez MRN: [id number] DOB:12-25-1960, 61 y.o., female Today's Date: 04/28/2022  PCP: Johna Roles, PA   REFERRING PROVIDER: Michael Boston, MD  END OF SESSION:   PT End of Session - 04/28/22 0851     Visit Number 5    Date for PT Re-Evaluation 06/16/22    Authorization Type self pay    PT Start Time 0848    PT Stop Time 0928    PT Time Calculation (min) 40 min    Activity Tolerance Patient tolerated treatment well    Behavior During Therapy [facility] for tasks assessed/performed               Past Medical History:  Diagnosis Date   Arthritis    OA bil knees and shoulders   Back pain    Chronic back pain    Diabetes mellitus without complication (HCC)    Forearm mass, right    Past Surgical History:  Procedure Laterality Date   NO PAST SURGERIES     Patient Active Problem List   Diagnosis Date Noted   Pap smear for cervical cancer screening 08/14/2013   HEADACHE 07/16/2010   PYELONEPHRITIS, HX OF 06/30/2010   FATIGUE 12/31/2008   HYPERCHOLESTEROLEMIA 09/03/2008   ANEMIA 09/03/2008   LUMBAGO 09/03/2008   PELVIC  PAIN 08/08/2008   VISUAL ACUITY, DECREASED 07/04/2008   CONSTIPATION 07/04/2008   ANAL OR RECTAL PAIN 07/04/2008    REFERRING DIAG:  K59.09 (ICD-10-CM) - Other constipation  K59.00 (ICD-10-CM) - Constipation, unspecified  K56.1 (ICD-10-CM) - Intussusception  K62.6 (ICD-10-CM) - Ulcer of anus and rectum  K62.5 (ICD-10-CM) - Hemorrhage of anus and rectum    THERAPY DIAG:  Other muscle spasm  Muscle weakness (generalized)  PERTINENT HISTORY: 4 vaginal deliveries, tearing during delivery  PRECAUTIONS: None  SUBJECTIVE: Pt states she is having bowel movements every day and they are painful.     PAIN:  Are you having pain? No   OBJECTIVE: (objective measures completed at initial evaluation unless otherwise dated)  MUSCLE LENGTH: Hamstrings: Right WFL deg; Left WFL  deg     GAIT:   Comments: WFL   POSTURE:  Lumbar lordosis and thoracic kyphosis   LUMBARAROM/PROM   A/PROM A/PROM  03/24/2022  Flexion 80%  Extension WFL  Right lateral flexion WFL  Left lateral flexion WFL  Right rotation 80%  Left rotation WFL   (Blank rows = not tested)   LE ROM:   Passive ROM Right 03/24/2022 Left 03/24/2022  Hip flexion 80% [facility]  Hip extension      Hip abduction      Hip adduction      Hip internal rotation 75% WFL  Hip external rotation 75% [facility]  Knee flexion      Knee extension      Ankle dorsiflexion      Ankle plantarflexion      Ankle inversion      Ankle eversion       (Blank rows = not tested)   LE MMT:   MMT Right 03/24/2022 Left 03/24/2022  Hip flexion 5/5 5/5  Hip extension      Hip abduction 4/5 5/5  Hip adduction 4+/5 4/5  Hip internal rotation      Hip external rotation      Knee flexion      Knee extension      Ankle dorsiflexion      Ankle plantarflexion  Ankle inversion      Ankle eversion        PELVIC MMT:   MMT   04/07/22   Vaginal    Internal Anal Sphincter   External Anal Sphincter    Puborectalis   Diastasis Recti    (Blank rows = not tested)         PALPATION:   General  lumbar and thoracic paraspinals tight - Lt side more tension in low thoracic region                 External Perineal Exam deferred due to time                             Internal Pelvic Floor deferred due to time   TONE: deferred due to time   PROLAPSE: Deferred due to time   TODAY'S TREATMENT  Treatment: 04/28/22  Hip rotation 30 sec  Butterfly stretch 30 sec SELF care:  using coconut oil for moisturizer Manual Lumbar, thoracic parapsinals; gluteals bil Patient confirms identification and approves physical therapist to perform internal soft tissue work  Internal soft tissue Rt side more tender obdurator and coccygeus  Treatment: 04/14/22 Self care: educated on Sitz bath  Manual Lumbar, thoracic parapsinals; gluteals  bil Trigger Point Dry-Needling  Treatment instructions: Expect mild to moderate muscle soreness. S/S of pneumothorax if dry needled over a lung field, and to seek immediate medical attention should they occur. Patient verbalized understanding of these instructions and education.  Patient Consent Given: Yes Education handout provided: Yes Muscles treated: lumbar erector spinea Electrical stimulation performed: No Parameters: N/A Treatment response/outcome: increased soft tissue length       PATIENT EDUCATION: Education details: Access Code: [email or web address] Person educated: Patient Education method: Explanation, Demonstration, Corporate treasurer cues, Verbal cues, and Handouts Education comprehension: verbalized understanding       HOME EXERCISE PROGRAM: Access Code: [email or web address] URL: [email or web address] Date: 04/28/2022 Prepared by: Jari Favre  Exercises - Supine Figure 4 Piriformis Stretch  - 1 x daily - 7 x weekly - 1 sets - 3 reps - 30 sec hold - Supine Single Knee to Chest Stretch  - 2 x daily - 7 x weekly - 1 sets - 5 reps - 10 sec hold - Supine Butterfly Groin Stretch  - 1 x daily - 7 x weekly - 1 sets - 3 reps - 30 sec hold  Patient Education - Trigger Point Dry Needling   ASSESSMENT:   CLINICAL IMPRESSION: Patient had her son on the phone to interpret today.  Pt is still reporting anal pain and a lot of but not straining to have BM.  She was educated in not using vasoline for lubricant due to maybe causing skin irritation and gave other options such as coconut oil.  Pt was more tight and TTP on the right obdurator.  She had some release with STM and was given stretches to continue to address tension at home.  Pt will benefit from skilled PT to address all above mentioned impairments and return to more normal defecation for reduced stress on soft tissues and reduced rectal bleeding.     OBJECTIVE IMPAIRMENTS decreased coordination, decreased ROM, decreased  strength, increased fascial restrictions, increased muscle spasms, impaired flexibility, postural dysfunction, and pain.    ACTIVITY LIMITATIONS  toileting .    PERSONAL FACTORS Time since onset of injury/illness/exacerbation and 1 comorbidity: 4 vaginal deliveries  are also affecting patient's functional outcome.  REHAB POTENTIAL: Good   CLINICAL DECISION MAKING: Evolving/moderate complexity   EVALUATION COMPLEXITY: Moderate     GOALS: Goals reviewed with patient? Yes   SHORT TERM GOALS: Target date: 04/21/2022   Ind with toileting techniques Baseline:04/07/22  Goal status: met   2.  Ind with initial HEP Baseline: 04/07/22  Goal status: Met     LONG TERM GOALS: Target date: 06/16/2022   Pt will be independent with advanced HEP to maintain improvements made throughout therapy   Baseline:  Goal status: INITIAL   2.  Pt will report her BMs are complete due to improved bowel habits and evacuation techniques.  Baseline:  Goal status: INITIAL   3.  Pt will report 5 BMs per week due to improved muscle tone and coordination with BMs.  Baseline:  Goal status: INITIAL   4.  Pt will report 50% reduction of pain due to improvements in posture, strength, and muscle length   Baseline:  Goal status: INITIAL     PLAN: PT FREQUENCY: 1x/week   PT DURATION: 12 weeks   PLANNED INTERVENTIONS: Therapeutic exercises, Therapeutic activity, Neuromuscular re-education, Balance training, Gait training, Patient/Family education, Joint mobilization, Dry Needling, Electrical stimulation, Cryotherapy, Moist heat, Taping, Biofeedback, and Manual therapy   PLAN FOR NEXT SESSION: lumbar and Rt gluteal STM and DN; continue work on breathing and relaxing pelvic release and gluteal release, cat cow or modified; f/u on using coconut oil      [employer], PT 04/28/2022, 8:51 AM   PHYSICAL THERAPY DISCHARGE SUMMARY  Visits from Start of Care: 5  Current functional level related to  goals / functional outcomes:   See above goals Remaining deficits: See above details   Education / Equipment: HEP  Patient agrees to discharge. Patient goals were not met. Patient is being discharged due to not returning since the last visit.  Gustavus Bryant, PT 06/23/22 9:11 AM

## 2022-04-28 ENCOUNTER — Ambulatory Visit: Payer: Commercial Managed Care - HMO | Attending: Surgery | Admitting: Physical Therapy

## 2022-04-28 DIAGNOSIS — M62838 Other muscle spasm: Secondary | ICD-10-CM | POA: Insufficient documentation

## 2022-04-28 DIAGNOSIS — M6281 Muscle weakness (generalized): Secondary | ICD-10-CM | POA: Diagnosis present

## 2022-05-05 ENCOUNTER — Encounter: Payer: Managed Care, Other (non HMO) | Admitting: Physical Therapy

## 2022-05-12 ENCOUNTER — Encounter: Payer: Managed Care, Other (non HMO) | Admitting: Physical Therapy

## 2022-06-15 NOTE — Therapy (Deleted)
OUTPATIENT PHYSICAL THERAPY TREATMENT NOTE   Patient Name: Mercedes Velasquez MRN: [id number] DOB:10/12/1961, 61 y.o., female Today's Date: 06/15/2022  PCP: Johna Roles, PA   REFERRING PROVIDER: Michael Boston, MD  END OF SESSION:       Past Medical History:  Diagnosis Date   Arthritis    OA bil knees and shoulders   Back pain    Chronic back pain    Diabetes mellitus without complication ([place])    Forearm mass, right    Past Surgical History:  Procedure Laterality Date   NO PAST SURGERIES     Patient Active Problem List   Diagnosis Date Noted   Pap smear for cervical cancer screening 08/14/2013   HEADACHE 07/16/2010   PYELONEPHRITIS, HX OF 06/30/2010   FATIGUE 12/31/2008   HYPERCHOLESTEROLEMIA 09/03/2008   ANEMIA 09/03/2008   LUMBAGO 09/03/2008   PELVIC  PAIN 08/08/2008   VISUAL ACUITY, DECREASED 07/04/2008   CONSTIPATION 07/04/2008   ANAL OR RECTAL PAIN 07/04/2008    REFERRING DIAG:  K59.09 (ICD-10-CM) - Other constipation  K59.00 (ICD-10-CM) - Constipation, unspecified  K56.1 (ICD-10-CM) - Intussusception  K62.6 (ICD-10-CM) - Ulcer of anus and rectum  K62.5 (ICD-10-CM) - Hemorrhage of anus and rectum    THERAPY DIAG:  No diagnosis found.  PERTINENT HISTORY: 4 vaginal deliveries, tearing during delivery  PRECAUTIONS: None  SUBJECTIVE: Pt states she is having bowel movements every day and they are painful.     PAIN:  Are you having pain? No   OBJECTIVE: (objective measures completed at initial evaluation unless otherwise dated)  MUSCLE LENGTH: Hamstrings: Right WFL deg; Left WFL deg     GAIT:   Comments: WFL   POSTURE:  Lumbar lordosis and thoracic kyphosis   LUMBARAROM/PROM   A/PROM A/PROM  03/24/2022  Flexion 80%  Extension WFL  Right lateral flexion WFL  Left lateral flexion WFL  Right rotation 80%  Left rotation WFL   (Blank rows = not tested)   LE ROM:   Passive ROM Right 03/24/2022 Left 03/24/2022  Hip flexion 80%  WFL  Hip extension      Hip abduction      Hip adduction      Hip internal rotation 75% WFL  Hip external rotation 75% WFL  Knee flexion      Knee extension      Ankle dorsiflexion      Ankle plantarflexion      Ankle inversion      Ankle eversion       (Blank rows = not tested)   LE MMT:   MMT Right 03/24/2022 Left 03/24/2022  Hip flexion 5/5 5/5  Hip extension      Hip abduction 4/5 5/5  Hip adduction 4+/5 4/5  Hip internal rotation      Hip external rotation      Knee flexion      Knee extension      Ankle dorsiflexion      Ankle plantarflexion      Ankle inversion      Ankle eversion        PELVIC MMT:   MMT   04/07/22   Vaginal    Internal Anal Sphincter   External Anal Sphincter    Puborectalis   Diastasis Recti    (Blank rows = not tested)         PALPATION:   General  lumbar and thoracic paraspinals tight - Lt side more tension in low thoracic region  External Perineal Exam deferred due to time                             Internal Pelvic Floor deferred due to time   TONE: deferred due to time   PROLAPSE: Deferred due to time   TODAY'S TREATMENT  Treatment: 04/28/22  Hip rotation 30 sec  Butterfly stretch 30 sec SELF care:  using coconut oil for moisturizer Manual Lumbar, thoracic parapsinals; gluteals bil Patient confirms identification and approves physical therapist to perform internal soft tissue work  Internal soft tissue Rt side more tender obdurator and coccygeus  Treatment: 04/14/22 Self care: educated on Sitz bath  Manual Lumbar, thoracic parapsinals; gluteals bil Trigger Point Dry-Needling  Treatment instructions: Expect mild to moderate muscle soreness. S/S of pneumothorax if dry needled over a lung field, and to seek immediate medical attention should they occur. Patient verbalized understanding of these instructions and education.  Patient Consent Given: Yes Education handout provided: Yes Muscles treated: lumbar  erector spinea Electrical stimulation performed: No Parameters: N/A Treatment response/outcome: increased soft tissue length       PATIENT EDUCATION: Education details: Access Code: [email or web address] Person educated: Patient Education method: Explanation, Demonstration, Corporate treasurer cues, Verbal cues, and Handouts Education comprehension: verbalized understanding       HOME EXERCISE PROGRAM: Access Code: [email or web address] URL: [email or web address] Date: 04/28/2022 Prepared by: Jari Favre  Exercises - Supine Figure 4 Piriformis Stretch  - 1 x daily - 7 x weekly - 1 sets - 3 reps - 30 sec hold - Supine Single Knee to Chest Stretch  - 2 x daily - 7 x weekly - 1 sets - 5 reps - 10 sec hold - Supine Butterfly Groin Stretch  - 1 x daily - 7 x weekly - 1 sets - 3 reps - 30 sec hold  Patient Education - Trigger Point Dry Needling   ASSESSMENT:   CLINICAL IMPRESSION: Patient had her son on the phone to interpret today.  Pt is still reporting anal pain and a lot of but not straining to have BM.  She was educated in not using vasoline for lubricant due to maybe causing skin irritation and gave other options such as coconut oil.  Pt was more tight and TTP on the right obdurator.  She had some release with STM and was given stretches to continue to address tension at home.  Pt will benefit from skilled PT to address all above mentioned impairments and return to more normal defecation for reduced stress on soft tissues and reduced rectal bleeding.     OBJECTIVE IMPAIRMENTS decreased coordination, decreased ROM, decreased strength, increased fascial restrictions, increased muscle spasms, impaired flexibility, postural dysfunction, and pain.    ACTIVITY LIMITATIONS  toileting .    PERSONAL FACTORS Time since onset of injury/illness/exacerbation and 1 comorbidity: 4 vaginal deliveries  are also affecting patient's functional outcome.      REHAB POTENTIAL: Good   CLINICAL DECISION  MAKING: Evolving/moderate complexity   EVALUATION COMPLEXITY: Moderate     GOALS: Goals reviewed with patient? Yes   SHORT TERM GOALS: Target date: 04/21/2022   Ind with toileting techniques Baseline:04/07/22  Goal status: met   2.  Ind with initial HEP Baseline: 04/07/22  Goal status: Met     LONG TERM GOALS: Target date: 06/16/2022   Pt will be independent with advanced HEP to maintain improvements made throughout therapy   Baseline:  Goal status: INITIAL   2.  Pt  will report her BMs are complete due to improved bowel habits and evacuation techniques.  Baseline:  Goal status: INITIAL   3.  Pt will report 5 BMs per week due to improved muscle tone and coordination with BMs.  Baseline:  Goal status: INITIAL   4.  Pt will report 50% reduction of pain due to improvements in posture, strength, and muscle length   Baseline:  Goal status: INITIAL     PLAN: PT FREQUENCY: 1x/week   PT DURATION: 12 weeks   PLANNED INTERVENTIONS: Therapeutic exercises, Therapeutic activity, Neuromuscular re-education, Balance training, Gait training, Patient/Family education, Joint mobilization, Dry Needling, Electrical stimulation, Cryotherapy, Moist heat, Taping, Biofeedback, and Manual therapy   PLAN FOR NEXT SESSION: lumbar and Rt gluteal STM and DN; continue work on breathing and relaxing pelvic release and gluteal release, cat cow or modified; f/u on using coconut oil      [employer], PT 06/15/2022, 4:46 PM

## 2022-06-16 ENCOUNTER — Ambulatory Visit: Payer: Commercial Managed Care - HMO | Attending: Surgery | Admitting: Physical Therapy

## 2022-06-16 ENCOUNTER — Telehealth: Payer: Self-pay | Admitting: Physical Therapy

## 2022-06-16 DIAGNOSIS — M6281 Muscle weakness (generalized): Secondary | ICD-10-CM | POA: Insufficient documentation

## 2022-06-16 DIAGNOSIS — M62838 Other muscle spasm: Secondary | ICD-10-CM | POA: Insufficient documentation

## 2022-06-16 NOTE — Telephone Encounter (Signed)
Pt called due to no show.  Mailbox was full and unable to leave message.  Russella Dar, PT 06/16/22 9:13 AM

## 2022-06-23 ENCOUNTER — Ambulatory Visit: Payer: Commercial Managed Care - HMO | Attending: Surgery | Admitting: Physical Therapy

## 2022-06-23 DIAGNOSIS — M62838 Other muscle spasm: Secondary | ICD-10-CM | POA: Insufficient documentation

## 2022-06-23 DIAGNOSIS — M6281 Muscle weakness (generalized): Secondary | ICD-10-CM | POA: Insufficient documentation

## 2022-06-30 ENCOUNTER — Encounter: Payer: Commercial Managed Care - HMO | Admitting: Physical Therapy

## 2022-07-07 ENCOUNTER — Encounter: Payer: Commercial Managed Care - HMO | Admitting: Physical Therapy

## 2022-12-07 ENCOUNTER — Ambulatory Visit
Admission: EM | Admit: 2022-12-07 | Discharge: 2022-12-07 | Disposition: A | Discharge status: Home / Self Care | Payer: 59 | Attending: Physician Assistant | Admitting: Physician Assistant

## 2022-12-07 DIAGNOSIS — M545 Low back pain, unspecified: Secondary | ICD-10-CM | POA: Diagnosis not present

## 2022-12-07 DIAGNOSIS — M25511 Pain in right shoulder: Secondary | ICD-10-CM

## 2022-12-07 DIAGNOSIS — M25512 Pain in left shoulder: Secondary | ICD-10-CM | POA: Diagnosis not present

## 2022-12-07 DIAGNOSIS — M62838 Other muscle spasm: Secondary | ICD-10-CM | POA: Diagnosis not present

## 2022-12-07 MED ORDER — TIZANIDINE HCL 4 MG PO TABS: 4.0000 mg | ORAL_TABLET | Freq: Four times a day (QID) | ORAL | 0 refills | Status: AC | PRN
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
## 2022-12-07 NOTE — Discharge Instructions (Signed)
Advised to take extreme Tylenol on a regular basis to help decrease pain and discomfort. Advise use Zanaflex 4 mg every 6 hours on a regular basis to decrease muscle spasm and irritability. Advised to use ice therapy, 10 minutes on 20 minutes off, 3-4 times throughout the evening to help reduce muscle spasm and pain.  Advised follow-up PCP or return to urgent care as needed.
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
## 2022-12-08 ENCOUNTER — Ambulatory Visit
Admission: RE | Admit: 2022-12-08 | Disposition: A | Discharge status: Home / Self Care | Payer: Self-pay | Source: Ambulatory Visit

## 2023-03-21 ENCOUNTER — Ambulatory Visit (INDEPENDENT_AMBULATORY_CARE_PROVIDER_SITE_OTHER): Payer: 59

## 2023-03-21 ENCOUNTER — Encounter (HOSPITAL_COMMUNITY): Payer: Self-pay | Admitting: Emergency Medicine

## 2023-03-21 ENCOUNTER — Ambulatory Visit (HOSPITAL_COMMUNITY)
Admission: EM | Admit: 2023-03-21 | Discharge: 2023-03-21 | Disposition: A | Discharge status: Home / Self Care | Payer: 59 | Attending: Internal Medicine | Admitting: Internal Medicine

## 2023-03-21 DIAGNOSIS — J069 Acute upper respiratory infection, unspecified: Secondary | ICD-10-CM

## 2023-03-21 DIAGNOSIS — R0602 Shortness of breath: Secondary | ICD-10-CM | POA: Diagnosis not present

## 2023-03-21 DIAGNOSIS — R079 Chest pain, unspecified: Secondary | ICD-10-CM | POA: Diagnosis not present

## 2023-03-21 DIAGNOSIS — Z1152 Encounter for screening for COVID-19: Secondary | ICD-10-CM | POA: Diagnosis not present

## 2023-03-21 DIAGNOSIS — J40 Bronchitis, not specified as acute or chronic: Secondary | ICD-10-CM | POA: Diagnosis not present

## 2023-03-21 DIAGNOSIS — R059 Cough, unspecified: Secondary | ICD-10-CM | POA: Diagnosis not present

## 2023-03-21 HISTORY — DX: Pure hypercholesterolemia, unspecified: E78.00

## 2023-03-21 MED ORDER — DEXAMETHASONE SODIUM PHOSPHATE 10 MG/ML IJ SOLN
INTRAMUSCULAR | Status: AC
  Filled 2023-03-21: qty 1

## 2023-03-21 MED ORDER — DEXAMETHASONE SODIUM PHOSPHATE 10 MG/ML IJ SOLN
10.0000 mg | Freq: Once | INTRAMUSCULAR | Status: AC
  Administered 2023-03-21: 10 mg via INTRAMUSCULAR

## 2023-03-21 MED ORDER — ALBUTEROL SULFATE HFA 108 (90 BASE) MCG/ACT IN AERS
INHALATION_SPRAY | RESPIRATORY_TRACT | Status: AC
  Filled 2023-03-21: qty 6.7

## 2023-03-21 MED ORDER — BENZONATATE 100 MG PO CAPS: 100.0000 mg | ORAL_CAPSULE | Freq: Three times a day (TID) | ORAL | 0 refills | Status: AC

## 2023-03-21 MED ORDER — ALBUTEROL SULFATE HFA 108 (90 BASE) MCG/ACT IN AERS
2.0000 | INHALATION_SPRAY | Freq: Once | RESPIRATORY_TRACT | Status: AC
  Administered 2023-03-21: 2 via RESPIRATORY_TRACT

## 2023-03-21 NOTE — ED Provider Notes (Addendum)
[facility]    CSN: [id number] Arrival date & time: 03/21/23  1003      History   Chief Complaint Chief Complaint  Patient presents with   Cough    HPI Mercedes Velasquez is a 62 y.o. female.   Patient with history of hyperlipidemia, type 2 diabetes, and chronic back pain presents to urgent care for evaluation of cough, rhinorrhea, generalized headache, and generalized fatigue for the last 2 days since Friday, March 19, 2023.  Cough is productive with clear sputum.  She states she experiences slight shortness of breath and bilateral chest discomfort when coughing but this resolves after she finishes coughing.  Headache is a 6 on a scale of 0-10 currently but has improved significantly after she had Tylenol this morning.  She reports chills without known fever at home.  States her chills improved after taking Tylenol as well.  Currently with low-grade temp at 99.4.  No heart palpitations, one-sided weakness, nausea, vomiting, abdominal pain, body aches, dizziness, blurry vision, recent fall/trauma/injury to the head, or recent antibiotic or steroid use.  Denies history of chronic respiratory problems.  Denies leg swelling and orthopnea.  Never smoker, denies drug use.  She has only been using Tylenol for symptomatic relief at home and states it is helping a little bit.   Cough   Past Medical History:  Diagnosis Date   Arthritis    OA bil knees and shoulders   Back pain    Chronic back pain    Diabetes mellitus without complication (HCC)    Forearm mass, right    High cholesterol     Patient Active Problem List   Diagnosis Date Noted   Pap smear for cervical cancer screening 08/14/2013   HEADACHE 07/16/2010   PYELONEPHRITIS, HX OF 06/30/2010   FATIGUE 12/31/2008   HYPERCHOLESTEROLEMIA 09/03/2008   ANEMIA 09/03/2008   LUMBAGO 09/03/2008   PELVIC  PAIN 08/08/2008   VISUAL ACUITY, DECREASED 07/04/2008   CONSTIPATION 07/04/2008   ANAL OR RECTAL PAIN 07/04/2008     Past Surgical History:  Procedure Laterality Date   NO PAST SURGERIES      OB History     Gravida  5   Para  4   Term  4   Preterm      AB  1   Living  3      SAB  1   IAB      Ectopic      Multiple      Live Births               Home Medications    Prior to Admission medications   Medication Sig Start Date End Date Taking? Authorizing Provider  benzonatate (TESSALON) 100 MG capsule Take 1 capsule (100 mg total) by mouth every 8 (eight) hours. 03/21/23  Yes Carlisle Beers, FNP  HYDROcodone-acetaminophen (NORCO/VICODIN) 5-325 MG tablet Take 1 tablet by mouth every 4 (four) hours as needed. 01/16/21   Tegeler, Canary Brim, MD  ibuprofen (ADVIL) 200 MG tablet Take 200 mg by mouth every 6 (six) hours as needed for fever, headache or mild pain.    [provider]  metFORMIN (GLUCOPHAGE-XR) 500 MG 24 hr tablet TAKE 1 TABLET BY MOUTH ONCE DAILY IN THE MORNING AND 2 TABLETS ONCE DAILY IN THE EVENING Patient taking differently: Take 500 mg by mouth 2 (two) times daily. 09/07/18   Dorothyann Peng, MD  ondansetron (ZOFRAN) 4 MG tablet Take 1 tablet (4 mg total)  by mouth every 8 (eight) hours as needed for nausea or vomiting. 01/16/21   Tegeler, Canary Brim, MD  ondansetron (ZOFRAN) 8 MG tablet Take 1 tablet (8 mg total) by mouth every 8 (eight) hours as needed for nausea or vomiting. 01/14/21   Rhys Martini, PA-C  rosuvastatin (CRESTOR) 5 MG tablet Take 5 mg by mouth 2 (two) times a week.    [provider]  tiZANidine (ZANAFLEX) 4 MG tablet Take 1 tablet (4 mg total) by mouth every 6 (six) hours as needed for muscle spasms. 12/07/22   Ellsworth Lennox, PA-C    Family History Family History  Problem Relation Age of Onset   Other Neg Hx     Social History Social History   Tobacco Use   Smoking status: Never   Smokeless tobacco: Never  Substance Use Topics   Alcohol use: No   Drug use: No     Allergies   Acetaminophen and  Simvastatin   Review of Systems Review of Systems  Respiratory:  Positive for cough.   Per HPI   Physical Exam Triage Vital Signs ED Triage Vitals [03/21/23 1019]  Enc Vitals Group     BP (!) 162/91     Pulse Rate 85     Resp 20     Temp 99.4 F (37.4 C)     Temp Source Oral     SpO2 91 %     Weight      Height      Head Circumference      Peak Flow      Pain Score 6     Pain Loc      Pain Edu?      Excl. in GC?    No data found.  Updated Vital Signs BP (!) 162/91 (BP Location: Left Arm)   Pulse 85   Temp 99.4 F (37.4 C) (Oral)   Resp 20   LMP 10/01/2013   SpO2 91%   Visual Acuity Right Eye Distance:   Left Eye Distance:   Bilateral Distance:    Right Eye Near:   Left Eye Near:    Bilateral Near:     Physical Exam Vitals and nursing note reviewed.  Constitutional:      Appearance: She is ill-appearing. She is not toxic-appearing.  HENT:     Head: Normocephalic and atraumatic.     Right Ear: Hearing, tympanic membrane, ear canal and external ear normal.     Left Ear: Hearing, tympanic membrane, ear canal and external ear normal.     Nose: Nose normal.     Mouth/Throat:     Lips: Pink.     Mouth: Mucous membranes are moist. No injury.     Tongue: No lesions. Tongue does not deviate from midline.     Palate: No mass and lesions.     Pharynx: Oropharynx is clear. Uvula midline. No pharyngeal swelling, oropharyngeal exudate, posterior oropharyngeal erythema or uvula swelling.     Tonsils: No tonsillar exudate or tonsillar abscesses.  Eyes:     General: Lids are normal. Vision grossly intact. Gaze aligned appropriately.     Extraocular Movements: Extraocular movements intact.     Conjunctiva/sclera: Conjunctivae normal.  Cardiovascular:     Rate and Rhythm: Normal rate and regular rhythm.     Heart sounds: Normal heart sounds, S1 normal and S2 normal.  Pulmonary:     Effort: Pulmonary effort is normal. No respiratory distress.     Breath sounds:  Normal air entry. No stridor. Rhonchi (focal rhonchi to the left lower lung field) present. No wheezing or rales.     Comments: Speaks in full sentences without difficulty.  No respiratory distress or increased respiratory effort noted on exam. Chest:     Chest wall: Tenderness (TTP to the bilateral chest wall and rib cage) present.  Musculoskeletal:     Cervical back: Neck supple.     Right lower leg: No edema.     Left lower leg: No edema.  Lymphadenopathy:     Cervical: Cervical adenopathy present.  Skin:    General: Skin is warm and dry.     Capillary Refill: Capillary refill takes less than 2 seconds.     Findings: No rash.  Neurological:     General: No focal deficit present.     Mental Status: She is alert and oriented to person, place, and time. Mental status is at baseline.     Cranial Nerves: No dysarthria or facial asymmetry.  Psychiatric:        Mood and Affect: Mood normal.        Speech: Speech normal.        Behavior: Behavior normal.        Thought Content: Thought content normal.        Judgment: Judgment normal.      UC Treatments / Results  Labs (all labs ordered are listed, but only abnormal results are displayed) Labs Reviewed  SARS CORONAVIRUS 2 (TAT 6-24 HRS)    EKG   Radiology DG Chest 2 View  Result Date: 03/21/2023 CLINICAL DATA:  Cough, chest and back pain EXAM: CHEST - 2 VIEW COMPARISON:  10/18/2013 FINDINGS: Cardiomegaly. Normal mediastinal contours. No focal pulmonary opacity. No pleural effusion or pneumothorax. No acute osseous abnormality. IMPRESSION: Cardiomegaly. No acute cardiopulmonary process. Electronically Signed   By: Wiliam Ke M.D.   On: 03/21/2023 11:13    Procedures Procedures (including critical care time)  Medications Ordered in UC Medications  dexamethasone (DECADRON) injection 10 mg (10 mg Intramuscular Given 03/21/23 1141)  albuterol (VENTOLIN HFA) 108 (90 Base) MCG/ACT inhaler 2 puff (2 puffs Inhalation Given  03/21/23 1141)    Initial Impression / Assessment and Plan / UC Course  I have reviewed the triage vital signs and the nursing notes.  Pertinent labs & imaging results that were available during my care of the patient were reviewed by me and considered in my medical decision making (see chart for details).   1. Acute upper respiratory infection COVID-19 testing pending. Will call patient if positive. Chest x-ray shows new cardiomegaly as compared to previous chest x-rays but is without acute cardiopulmonary process. Albuterol given to treat shortness of breath in clinic plus dexamethasone IM to treat inflammation to lungs to cover for developing bronchitis etiology. Advised tylenol every 6 hours as needed for fever, chills, and headache. May use tessalon perles every 8 hours as needed for cough.   Chest discomfort likely due to cough, low suspicion for ACS. Oxygen saturation 91% on room air in triage. After taking a few deep breaths, oxygen rises to 95% on room air. May use albuterol every 4-6 hours as needed for cough and shortness of breath, suspect this will further help to open up the lungs and provide relief/raise oxygen further. No indication for referral to ED as she is nontoxic in appearance with overall hemodynamically stable vital signs.   Discussed physical exam and available lab work findings in clinic with patient.  Counseled  patient regarding appropriate use of medications and potential side effects for all medications recommended or prescribed today. Discussed red flag signs and symptoms of worsening condition,when to call the PCP office, return to urgent care, and when to seek higher level of care in the emergency department. Patient verbalizes understanding and agreement with plan. All questions answered. Patient discharged in stable condition.    Final Clinical Impressions(s) / UC Diagnoses   Final diagnoses:  Bronchitis  Shortness of breath     Discharge Instructions       X-ray is negative for pneumonia, this is reassuring. Image does show that your heart is slightly bigger than normal, this could indicate it is working a little bit harder to pump the blood in your body.   Please follow-up with your primary care provider regarding the finding of enlarged heart.   I gave you a shot of steroid to help with shortness of breath and cough that will continue to work in your body over the next 2-3 days. This may temporarily increase your blood sugars.   Take tessalon perles cough medicine every 8 hours as needed.   Albuterol inhaler 4-6 hours as needed for cough, shortness of breath, and wheeze.   If you develop any new or worsening symptoms or do not improve in the next 2 to 3 days, please return.  If your symptoms are severe, please go to the emergency room.  Follow-up with your primary care provider for further evaluation and management of your symptoms as well as ongoing wellness visits.  I hope you feel better!     ED Prescriptions     Medication Sig Dispense Auth. Provider   benzonatate (TESSALON) 100 MG capsule Take 1 capsule (100 mg total) by mouth every 8 (eight) hours. 21 capsule Carlisle Beers, FNP      PDMP not reviewed this encounter.   Carlisle Beers, FNP 03/21/23 2236    Carlisle Beers, FNP 03/21/23 2237

## 2023-03-21 NOTE — Discharge Instructions (Addendum)
X-ray is negative for pneumonia, this is reassuring. Image does show that your heart is slightly bigger than normal, this could indicate it is working a little bit harder to pump the blood in your body.   Please follow-up with your primary care provider regarding the finding of enlarged heart.   I gave you a shot of steroid to help with shortness of breath and cough that will continue to work in your body over the next 2-3 days. This may temporarily increase your blood sugars.   Take tessalon perles cough medicine every 8 hours as needed.   Albuterol inhaler 4-6 hours as needed for cough, shortness of breath, and wheeze.   If you develop any new or worsening symptoms or do not improve in the next 2 to 3 days, please return.  If your symptoms are severe, please go to the emergency room.  Follow-up with your primary care provider for further evaluation and management of your symptoms as well as ongoing wellness visits.  I hope you feel better!

## 2023-03-21 NOTE — ED Triage Notes (Signed)
Pt had cough for a few days. Now having chest and back pains when coughs. Cough is dry. Also have headache. Took Nyquil and tylenol

## 2023-03-22 LAB — SARS CORONAVIRUS 2 (TAT 6-24 HRS): SARS Coronavirus 2: NEGATIVE

## 2023-07-27 DIAGNOSIS — E1165 Type 2 diabetes mellitus with hyperglycemia: Secondary | ICD-10-CM | POA: Diagnosis not present

## 2023-07-27 DIAGNOSIS — Z136 Encounter for screening for cardiovascular disorders: Secondary | ICD-10-CM | POA: Diagnosis not present

## 2023-07-27 DIAGNOSIS — Z0001 Encounter for general adult medical examination with abnormal findings: Secondary | ICD-10-CM | POA: Diagnosis not present

## 2023-07-27 DIAGNOSIS — E782 Mixed hyperlipidemia: Secondary | ICD-10-CM | POA: Diagnosis not present

## 2023-08-17 DIAGNOSIS — E1142 Type 2 diabetes mellitus with diabetic polyneuropathy: Secondary | ICD-10-CM | POA: Diagnosis not present

## 2023-08-17 DIAGNOSIS — D500 Iron deficiency anemia secondary to blood loss (chronic): Secondary | ICD-10-CM | POA: Diagnosis not present

## 2023-08-17 DIAGNOSIS — E782 Mixed hyperlipidemia: Secondary | ICD-10-CM | POA: Diagnosis not present

## 2023-08-17 DIAGNOSIS — E1165 Type 2 diabetes mellitus with hyperglycemia: Secondary | ICD-10-CM | POA: Diagnosis not present

## 2023-08-17 DIAGNOSIS — M170 Bilateral primary osteoarthritis of knee: Secondary | ICD-10-CM | POA: Diagnosis not present

## 2023-10-08 ENCOUNTER — Other Ambulatory Visit: Payer: Self-pay | Admitting: Physician Assistant

## 2023-10-08 DIAGNOSIS — Z13820 Encounter for screening for osteoporosis: Secondary | ICD-10-CM

## 2023-10-08 DIAGNOSIS — Z1231 Encounter for screening mammogram for malignant neoplasm of breast: Secondary | ICD-10-CM

## 2023-10-15 LAB — AMB RESULTS CONSOLE CBG: Glucose: 186

## 2023-11-09 DIAGNOSIS — E782 Mixed hyperlipidemia: Secondary | ICD-10-CM | POA: Diagnosis not present

## 2023-11-09 DIAGNOSIS — E1165 Type 2 diabetes mellitus with hyperglycemia: Secondary | ICD-10-CM | POA: Diagnosis not present

## 2023-11-09 DIAGNOSIS — M170 Bilateral primary osteoarthritis of knee: Secondary | ICD-10-CM | POA: Diagnosis not present

## 2023-11-09 DIAGNOSIS — D500 Iron deficiency anemia secondary to blood loss (chronic): Secondary | ICD-10-CM | POA: Diagnosis not present

## 2023-11-09 DIAGNOSIS — E1142 Type 2 diabetes mellitus with diabetic polyneuropathy: Secondary | ICD-10-CM | POA: Diagnosis not present

## 2023-11-25 DIAGNOSIS — Z780 Asymptomatic menopausal state: Secondary | ICD-10-CM | POA: Diagnosis not present

## 2023-11-25 DIAGNOSIS — Z1231 Encounter for screening mammogram for malignant neoplasm of breast: Secondary | ICD-10-CM | POA: Diagnosis not present

## 2023-11-30 ENCOUNTER — Encounter: Payer: Self-pay | Admitting: *Deleted

## 2023-11-30 NOTE — Progress Notes (Signed)
 Pt attended 10/15/23 screening event where her b/p was 134/75 and her blood sugar was 186. At the event, the pt did not note a PCP name (but event scribe added one per [facility] banner), did confirm have insurance, noted she was not a smoker and did not identify any SDOH. Chart review indicates pt has historically received primary care a multiple sites over the past 12 months with Dr. Catalina at [facility], with whom pt had a general adult medical exam on 07/27/23. Pt has also seen a primary care provider on 11/25/23 at [facility] for MMG and DEXA and which indicated pt's PCP of record is Gerard Roys, PA-C (start date for PCP being 12/23/18) from [facility]. Chart review also shows Schuyler Savin PA from [facility] [place] is the [facility]-listed PCP. During initial event f/u call with pt, she thanked the [facility] Equity team member for calling to check on her and confirmed she saw her PCP recently but could not remember his name. Health Equity team member called above providers' offices and Dr. Joesphine office confirmed he was seeing pt as her PCP, with most recent visit being on 11/09/23. Event results given to Dr. Alyse office staff for his review. No additional health equity team support indicated at this time.

## 2024-01-11 DIAGNOSIS — M170 Bilateral primary osteoarthritis of knee: Secondary | ICD-10-CM | POA: Diagnosis not present

## 2024-01-11 DIAGNOSIS — D500 Iron deficiency anemia secondary to blood loss (chronic): Secondary | ICD-10-CM | POA: Diagnosis not present

## 2024-01-11 DIAGNOSIS — E782 Mixed hyperlipidemia: Secondary | ICD-10-CM | POA: Diagnosis not present

## 2024-01-11 DIAGNOSIS — E1165 Type 2 diabetes mellitus with hyperglycemia: Secondary | ICD-10-CM | POA: Diagnosis not present

## 2024-01-11 DIAGNOSIS — E1142 Type 2 diabetes mellitus with diabetic polyneuropathy: Secondary | ICD-10-CM | POA: Diagnosis not present

## 2024-01-11 DIAGNOSIS — Z23 Encounter for immunization: Secondary | ICD-10-CM | POA: Diagnosis not present

## 2024-02-29 DIAGNOSIS — E782 Mixed hyperlipidemia: Secondary | ICD-10-CM | POA: Diagnosis not present

## 2024-02-29 DIAGNOSIS — D500 Iron deficiency anemia secondary to blood loss (chronic): Secondary | ICD-10-CM | POA: Diagnosis not present

## 2024-02-29 DIAGNOSIS — E1165 Type 2 diabetes mellitus with hyperglycemia: Secondary | ICD-10-CM | POA: Diagnosis not present

## 2024-02-29 DIAGNOSIS — M170 Bilateral primary osteoarthritis of knee: Secondary | ICD-10-CM | POA: Diagnosis not present

## 2024-03-21 DIAGNOSIS — K625 Hemorrhage of anus and rectum: Secondary | ICD-10-CM | POA: Diagnosis not present

## 2024-03-21 DIAGNOSIS — Z1211 Encounter for screening for malignant neoplasm of colon: Secondary | ICD-10-CM | POA: Diagnosis not present

## 2024-03-21 DIAGNOSIS — K5904 Chronic idiopathic constipation: Secondary | ICD-10-CM | POA: Diagnosis not present

## 2024-04-07 DIAGNOSIS — K6289 Other specified diseases of anus and rectum: Secondary | ICD-10-CM | POA: Diagnosis not present

## 2024-04-07 DIAGNOSIS — K633 Ulcer of intestine: Secondary | ICD-10-CM | POA: Diagnosis not present

## 2024-04-07 DIAGNOSIS — K625 Hemorrhage of anus and rectum: Secondary | ICD-10-CM | POA: Diagnosis not present

## 2024-04-07 DIAGNOSIS — Z1211 Encounter for screening for malignant neoplasm of colon: Secondary | ICD-10-CM | POA: Diagnosis not present

## 2024-04-25 DIAGNOSIS — E782 Mixed hyperlipidemia: Secondary | ICD-10-CM | POA: Diagnosis not present

## 2024-04-25 DIAGNOSIS — E1165 Type 2 diabetes mellitus with hyperglycemia: Secondary | ICD-10-CM | POA: Diagnosis not present

## 2024-04-25 DIAGNOSIS — M170 Bilateral primary osteoarthritis of knee: Secondary | ICD-10-CM | POA: Diagnosis not present

## 2024-04-25 DIAGNOSIS — D500 Iron deficiency anemia secondary to blood loss (chronic): Secondary | ICD-10-CM | POA: Diagnosis not present

## 2024-04-25 DIAGNOSIS — K5909 Other constipation: Secondary | ICD-10-CM | POA: Diagnosis not present

## 2024-05-30 DIAGNOSIS — E1165 Type 2 diabetes mellitus with hyperglycemia: Secondary | ICD-10-CM | POA: Diagnosis not present

## 2024-05-30 DIAGNOSIS — K5909 Other constipation: Secondary | ICD-10-CM | POA: Diagnosis not present

## 2024-05-30 DIAGNOSIS — E782 Mixed hyperlipidemia: Secondary | ICD-10-CM | POA: Diagnosis not present

## 2024-05-30 DIAGNOSIS — Z0001 Encounter for general adult medical examination with abnormal findings: Secondary | ICD-10-CM | POA: Diagnosis not present

## 2024-05-30 DIAGNOSIS — M170 Bilateral primary osteoarthritis of knee: Secondary | ICD-10-CM | POA: Diagnosis not present

## 2024-05-30 DIAGNOSIS — D500 Iron deficiency anemia secondary to blood loss (chronic): Secondary | ICD-10-CM | POA: Diagnosis not present

## 2024-06-13 DIAGNOSIS — K5904 Chronic idiopathic constipation: Secondary | ICD-10-CM | POA: Diagnosis not present

## 2024-06-13 DIAGNOSIS — D509 Iron deficiency anemia, unspecified: Secondary | ICD-10-CM | POA: Diagnosis not present

## 2024-06-13 DIAGNOSIS — E669 Obesity, unspecified: Secondary | ICD-10-CM | POA: Diagnosis not present

## 2024-08-29 DIAGNOSIS — D500 Iron deficiency anemia secondary to blood loss (chronic): Secondary | ICD-10-CM | POA: Diagnosis not present

## 2024-08-29 DIAGNOSIS — K5909 Other constipation: Secondary | ICD-10-CM | POA: Diagnosis not present

## 2024-08-29 DIAGNOSIS — Z23 Encounter for immunization: Secondary | ICD-10-CM | POA: Diagnosis not present

## 2024-08-29 DIAGNOSIS — E782 Mixed hyperlipidemia: Secondary | ICD-10-CM | POA: Diagnosis not present

## 2024-08-29 DIAGNOSIS — M170 Bilateral primary osteoarthritis of knee: Secondary | ICD-10-CM | POA: Diagnosis not present

## 2024-08-29 DIAGNOSIS — E1165 Type 2 diabetes mellitus with hyperglycemia: Secondary | ICD-10-CM | POA: Diagnosis not present

## 2024-08-29 NOTE — Progress Notes (Unsigned)
 Mercedes Velasquez                                          MRN: [id number]   08/29/2024   The [facility] Quality Team Specialist reviewed this patient medical record for the purposes of chart review for care gap closure. The following were reviewed: chart review for care gap closure-diabetic eye exam and kidney health evaluation for diabetes:{[facility] QUALITY SPECIALIST CARE GAP SUB [phone]}.    [facility] Quality Team

## 2024-08-30 DIAGNOSIS — E1165 Type 2 diabetes mellitus with hyperglycemia: Secondary | ICD-10-CM | POA: Diagnosis not present

## 2024-08-30 DIAGNOSIS — M170 Bilateral primary osteoarthritis of knee: Secondary | ICD-10-CM | POA: Diagnosis not present

## 2024-08-30 DIAGNOSIS — E782 Mixed hyperlipidemia: Secondary | ICD-10-CM | POA: Diagnosis not present

## 2024-08-30 DIAGNOSIS — K5909 Other constipation: Secondary | ICD-10-CM | POA: Diagnosis not present

## 2024-08-30 DIAGNOSIS — D500 Iron deficiency anemia secondary to blood loss (chronic): Secondary | ICD-10-CM | POA: Diagnosis not present

## 2024-09-19 ENCOUNTER — Ambulatory Visit: Admitting: Podiatry

## 2024-10-10 DIAGNOSIS — Z008 Encounter for other general examination: Secondary | ICD-10-CM | POA: Diagnosis not present
